# Patient Record
Sex: Female | Born: 1945 | Race: White | Hispanic: No | Marital: Married | Smoking: Former smoker
Health system: Southern US, Community
[De-identification: ages and names within clinical notes are randomized; demographics above are authoritative.]

## PROBLEM LIST (undated history)

## (undated) DIAGNOSIS — K219 Gastro-esophageal reflux disease without esophagitis: Secondary | ICD-10-CM

## (undated) DIAGNOSIS — H6990 Unspecified Eustachian tube disorder, unspecified ear: Secondary | ICD-10-CM

## (undated) DIAGNOSIS — IMO0002 Reserved for concepts with insufficient information to code with codable children: Secondary | ICD-10-CM

## (undated) DIAGNOSIS — T7840XA Allergy, unspecified, initial encounter: Secondary | ICD-10-CM

## (undated) DIAGNOSIS — J310 Chronic rhinitis: Secondary | ICD-10-CM

## (undated) DIAGNOSIS — E785 Hyperlipidemia, unspecified: Secondary | ICD-10-CM

## (undated) DIAGNOSIS — N390 Urinary tract infection, site not specified: Secondary | ICD-10-CM

## (undated) DIAGNOSIS — M8580 Other specified disorders of bone density and structure, unspecified site: Secondary | ICD-10-CM

## (undated) DIAGNOSIS — C50919 Malignant neoplasm of unspecified site of unspecified female breast: Secondary | ICD-10-CM

## (undated) DIAGNOSIS — M1990 Unspecified osteoarthritis, unspecified site: Secondary | ICD-10-CM

## (undated) DIAGNOSIS — H6980 Other specified disorders of Eustachian tube, unspecified ear: Secondary | ICD-10-CM

## (undated) DIAGNOSIS — H269 Unspecified cataract: Secondary | ICD-10-CM

## (undated) HISTORY — DX: Allergy, unspecified, initial encounter: T78.40XA

## (undated) HISTORY — DX: Other specified disorders of bone density and structure, unspecified site: M85.80

## (undated) HISTORY — DX: Unspecified eustachian tube disorder, unspecified ear: H69.90

## (undated) HISTORY — DX: Malignant neoplasm of unspecified site of unspecified female breast: C50.919

## (undated) HISTORY — DX: Urinary tract infection, site not specified: N39.0

## (undated) HISTORY — DX: Gastro-esophageal reflux disease without esophagitis: K21.9

## (undated) HISTORY — DX: Reserved for concepts with insufficient information to code with codable children: IMO0002

## (undated) HISTORY — DX: Unspecified cataract: H26.9

## (undated) HISTORY — DX: Chronic rhinitis: J31.0

## (undated) HISTORY — DX: Other specified disorders of Eustachian tube, unspecified ear: H69.80

## (undated) HISTORY — DX: Unspecified osteoarthritis, unspecified site: M19.90

## (undated) HISTORY — PX: CATARACT EXTRACTION: SUR2

## (undated) HISTORY — PX: COLONOSCOPY: SHX174

## (undated) HISTORY — DX: Hyperlipidemia, unspecified: E78.5

---

## 1991-08-30 DIAGNOSIS — C50919 Malignant neoplasm of unspecified site of unspecified female breast: Secondary | ICD-10-CM

## 1991-08-30 HISTORY — PX: MASTECTOMY: SHX3

## 1991-08-30 HISTORY — DX: Malignant neoplasm of unspecified site of unspecified female breast: C50.919

## 1997-12-23 ENCOUNTER — Other Ambulatory Visit: Admission: RE | Admit: 1997-12-23 | Discharge: 1997-12-23 | Payer: Self-pay | Admitting: Gynecology

## 1999-01-05 ENCOUNTER — Ambulatory Visit (HOSPITAL_COMMUNITY): Admission: RE | Admit: 1999-01-05 | Discharge: 1999-01-05 | Payer: Self-pay | Admitting: Gastroenterology

## 1999-11-22 ENCOUNTER — Encounter: Payer: Self-pay | Admitting: Oncology

## 1999-11-22 ENCOUNTER — Encounter: Admission: RE | Admit: 1999-11-22 | Discharge: 1999-11-22 | Payer: Self-pay | Admitting: Oncology

## 1999-12-02 ENCOUNTER — Other Ambulatory Visit: Admission: RE | Admit: 1999-12-02 | Discharge: 1999-12-02 | Payer: Self-pay | Admitting: Gynecology

## 2000-12-05 ENCOUNTER — Other Ambulatory Visit: Admission: RE | Admit: 2000-12-05 | Discharge: 2000-12-05 | Payer: Self-pay | Admitting: Gynecology

## 2001-06-27 ENCOUNTER — Encounter: Payer: Self-pay | Admitting: Family Medicine

## 2001-06-27 ENCOUNTER — Encounter: Admission: RE | Admit: 2001-06-27 | Discharge: 2001-06-27 | Payer: Self-pay | Admitting: Family Medicine

## 2001-07-10 ENCOUNTER — Encounter: Payer: Self-pay | Admitting: Family Medicine

## 2001-07-10 ENCOUNTER — Encounter: Admission: RE | Admit: 2001-07-10 | Discharge: 2001-07-10 | Payer: Self-pay | Admitting: Family Medicine

## 2001-11-13 ENCOUNTER — Encounter: Payer: Self-pay | Admitting: Oncology

## 2001-11-13 ENCOUNTER — Ambulatory Visit (HOSPITAL_COMMUNITY): Admission: RE | Admit: 2001-11-13 | Discharge: 2001-11-13 | Payer: Self-pay | Admitting: Oncology

## 2002-05-21 ENCOUNTER — Other Ambulatory Visit: Admission: RE | Admit: 2002-05-21 | Discharge: 2002-05-21 | Payer: Self-pay | Admitting: Obstetrics and Gynecology

## 2002-11-12 ENCOUNTER — Ambulatory Visit (HOSPITAL_COMMUNITY): Admission: RE | Admit: 2002-11-12 | Discharge: 2002-11-12 | Payer: Self-pay | Admitting: Oncology

## 2002-11-12 ENCOUNTER — Encounter: Payer: Self-pay | Admitting: Oncology

## 2003-07-15 ENCOUNTER — Encounter: Admission: RE | Admit: 2003-07-15 | Discharge: 2003-07-15 | Payer: Self-pay | Admitting: Family Medicine

## 2003-08-11 ENCOUNTER — Other Ambulatory Visit: Admission: RE | Admit: 2003-08-11 | Discharge: 2003-08-11 | Payer: Self-pay | Admitting: Obstetrics and Gynecology

## 2003-08-12 ENCOUNTER — Encounter: Admission: RE | Admit: 2003-08-12 | Discharge: 2003-08-12 | Payer: Self-pay | Admitting: Obstetrics and Gynecology

## 2003-10-25 ENCOUNTER — Emergency Department (HOSPITAL_COMMUNITY): Admission: EM | Admit: 2003-10-25 | Discharge: 2003-10-25 | Payer: Self-pay | Admitting: Emergency Medicine

## 2004-08-27 ENCOUNTER — Encounter: Admission: RE | Admit: 2004-08-27 | Discharge: 2004-08-27 | Payer: Self-pay | Admitting: Family Medicine

## 2004-10-15 ENCOUNTER — Other Ambulatory Visit: Admission: RE | Admit: 2004-10-15 | Discharge: 2004-10-15 | Payer: Self-pay | Admitting: Obstetrics and Gynecology

## 2004-12-14 ENCOUNTER — Ambulatory Visit: Payer: Self-pay | Admitting: Oncology

## 2004-12-14 ENCOUNTER — Encounter: Admission: RE | Admit: 2004-12-14 | Discharge: 2004-12-14 | Payer: Self-pay | Admitting: Oncology

## 2005-12-12 ENCOUNTER — Ambulatory Visit: Payer: Self-pay | Admitting: Oncology

## 2005-12-13 LAB — COMPREHENSIVE METABOLIC PANEL
ALT: 13 U/L (ref 0–40)
AST: 19 U/L (ref 0–37)
Albumin: 4.5 g/dL (ref 3.5–5.2)
Alkaline Phosphatase: 68 U/L (ref 39–117)
Calcium: 9.4 mg/dL (ref 8.4–10.5)
Chloride: 102 mEq/L (ref 96–112)
Potassium: 4 mEq/L (ref 3.5–5.3)
Sodium: 140 mEq/L (ref 135–145)
Total Protein: 6.5 g/dL (ref 6.0–8.3)

## 2005-12-13 LAB — CBC WITH DIFFERENTIAL/PLATELET
Basophils Absolute: 0 10*3/uL (ref 0.0–0.1)
EOS%: 1.7 % (ref 0.0–7.0)
Eosinophils Absolute: 0.1 10*3/uL (ref 0.0–0.5)
HGB: 14 g/dL (ref 11.6–15.9)
MCH: 30.7 pg (ref 26.0–34.0)
MCV: 89.7 fL (ref 81.0–101.0)
MONO%: 6.4 % (ref 0.0–13.0)
NEUT#: 2.9 10*3/uL (ref 1.5–6.5)
RBC: 4.57 10*6/uL (ref 3.70–5.32)
RDW: 12.2 % (ref 11.3–14.5)
lymph#: 1.9 10*3/uL (ref 0.9–3.3)

## 2006-03-08 ENCOUNTER — Ambulatory Visit: Payer: Self-pay | Admitting: Oncology

## 2006-09-14 ENCOUNTER — Encounter: Admission: RE | Admit: 2006-09-14 | Discharge: 2006-09-14 | Payer: Self-pay | Admitting: Family Medicine

## 2006-10-05 ENCOUNTER — Encounter: Admission: RE | Admit: 2006-10-05 | Discharge: 2006-10-05 | Payer: Self-pay | Admitting: Family Medicine

## 2006-12-05 ENCOUNTER — Ambulatory Visit: Payer: Self-pay | Admitting: Oncology

## 2006-12-08 LAB — COMPREHENSIVE METABOLIC PANEL
AST: 22 U/L (ref 0–37)
Alkaline Phosphatase: 82 U/L (ref 39–117)
Glucose, Bld: 95 mg/dL (ref 70–99)
Sodium: 139 mEq/L (ref 135–145)
Total Bilirubin: 0.4 mg/dL (ref 0.3–1.2)
Total Protein: 6.3 g/dL (ref 6.0–8.3)

## 2006-12-08 LAB — CBC WITH DIFFERENTIAL/PLATELET
BASO%: 0.6 % (ref 0.0–2.0)
EOS%: 1.8 % (ref 0.0–7.0)
Eosinophils Absolute: 0.1 10*3/uL (ref 0.0–0.5)
LYMPH%: 48.9 % — ABNORMAL HIGH (ref 14.0–48.0)
MCH: 31.5 pg (ref 26.0–34.0)
MCHC: 35.5 g/dL (ref 32.0–36.0)
MCV: 88.6 fL (ref 81.0–101.0)
MONO%: 8.4 % (ref 0.0–13.0)
Platelets: 234 10*3/uL (ref 145–400)
RBC: 4.36 10*6/uL (ref 3.70–5.32)
RDW: 12.1 % (ref 11.3–14.5)

## 2006-12-08 LAB — LACTATE DEHYDROGENASE: LDH: 167 U/L (ref 94–250)

## 2007-12-12 ENCOUNTER — Ambulatory Visit: Payer: Self-pay | Admitting: Oncology

## 2007-12-17 LAB — CBC WITH DIFFERENTIAL/PLATELET
BASO%: 1.8 % (ref 0.0–2.0)
Eosinophils Absolute: 0.1 10*3/uL (ref 0.0–0.5)
LYMPH%: 43.6 % (ref 14.0–48.0)
MCHC: 35.5 g/dL (ref 32.0–36.0)
MCV: 88.7 fL (ref 81.0–101.0)
MONO#: 0.4 10*3/uL (ref 0.1–0.9)
MONO%: 8.1 % (ref 0.0–13.0)
NEUT#: 2.1 10*3/uL (ref 1.5–6.5)
Platelets: 236 10*3/uL (ref 145–400)
RBC: 4.59 10*6/uL (ref 3.70–5.32)
RDW: 12.2 % (ref 11.3–14.5)
WBC: 4.6 10*3/uL (ref 3.9–10.0)

## 2007-12-18 LAB — COMPREHENSIVE METABOLIC PANEL
ALT: 17 U/L (ref 0–35)
Albumin: 4.3 g/dL (ref 3.5–5.2)
Alkaline Phosphatase: 78 U/L (ref 39–117)
Glucose, Bld: 89 mg/dL (ref 70–99)
Potassium: 4.3 mEq/L (ref 3.5–5.3)
Sodium: 139 mEq/L (ref 135–145)
Total Bilirubin: 0.5 mg/dL (ref 0.3–1.2)
Total Protein: 6.5 g/dL (ref 6.0–8.3)

## 2007-12-18 LAB — LACTATE DEHYDROGENASE: LDH: 165 U/L (ref 94–250)

## 2008-08-29 HISTORY — PX: OTHER SURGICAL HISTORY: SHX169

## 2008-12-12 ENCOUNTER — Ambulatory Visit: Payer: Self-pay | Admitting: Oncology

## 2008-12-16 LAB — CBC WITH DIFFERENTIAL/PLATELET
Basophils Absolute: 0 10*3/uL (ref 0.0–0.1)
Eosinophils Absolute: 0.1 10*3/uL (ref 0.0–0.5)
HGB: 14.3 g/dL (ref 11.6–15.9)
MCV: 90.9 fL (ref 79.5–101.0)
MONO#: 0.3 10*3/uL (ref 0.1–0.9)
NEUT#: 2.7 10*3/uL (ref 1.5–6.5)
RBC: 4.56 10*6/uL (ref 3.70–5.45)
RDW: 12.3 % (ref 11.2–14.5)
WBC: 5.1 10*3/uL (ref 3.9–10.3)
lymph#: 2 10*3/uL (ref 0.9–3.3)

## 2008-12-16 LAB — COMPREHENSIVE METABOLIC PANEL
Albumin: 4.5 g/dL (ref 3.5–5.2)
BUN: 14 mg/dL (ref 6–23)
Calcium: 9.3 mg/dL (ref 8.4–10.5)
Chloride: 104 mEq/L (ref 96–112)
Glucose, Bld: 84 mg/dL (ref 70–99)
Potassium: 4.1 mEq/L (ref 3.5–5.3)
Sodium: 142 mEq/L (ref 135–145)
Total Protein: 6.4 g/dL (ref 6.0–8.3)

## 2009-04-27 ENCOUNTER — Ambulatory Visit (HOSPITAL_COMMUNITY): Admission: RE | Admit: 2009-04-27 | Discharge: 2009-04-27 | Payer: Self-pay | Admitting: Obstetrics and Gynecology

## 2009-04-27 ENCOUNTER — Encounter (INDEPENDENT_AMBULATORY_CARE_PROVIDER_SITE_OTHER): Payer: Self-pay | Admitting: Obstetrics and Gynecology

## 2009-09-16 ENCOUNTER — Encounter: Admission: RE | Admit: 2009-09-16 | Discharge: 2009-09-16 | Payer: Self-pay | Admitting: Family Medicine

## 2009-09-16 LAB — HM DEXA SCAN

## 2009-12-10 ENCOUNTER — Ambulatory Visit: Payer: Self-pay | Admitting: Oncology

## 2009-12-14 LAB — CBC WITH DIFFERENTIAL/PLATELET
BASO%: 0.3 % (ref 0.0–2.0)
Basophils Absolute: 0 10*3/uL (ref 0.0–0.1)
EOS%: 1 % (ref 0.0–7.0)
Eosinophils Absolute: 0.1 10*3/uL (ref 0.0–0.5)
HCT: 41.8 % (ref 34.8–46.6)
HGB: 14.6 g/dL (ref 11.6–15.9)
LYMPH%: 30.3 % (ref 14.0–49.7)
MCH: 32.1 pg (ref 25.1–34.0)
MCHC: 35 g/dL (ref 31.5–36.0)
MCV: 91.7 fL (ref 79.5–101.0)
MONO#: 0.4 10*3/uL (ref 0.1–0.9)
MONO%: 6.5 % (ref 0.0–14.0)
NEUT#: 3.4 10*3/uL (ref 1.5–6.5)
NEUT%: 61.9 % (ref 38.4–76.8)
Platelets: 237 10*3/uL (ref 145–400)
RBC: 4.55 10*6/uL (ref 3.70–5.45)
RDW: 12 % (ref 11.2–14.5)
WBC: 5.4 10*3/uL (ref 3.9–10.3)
lymph#: 1.6 10*3/uL (ref 0.9–3.3)

## 2009-12-14 LAB — COMPREHENSIVE METABOLIC PANEL
ALT: 19 U/L (ref 0–35)
AST: 26 U/L (ref 0–37)
Albumin: 4.7 g/dL (ref 3.5–5.2)
Alkaline Phosphatase: 81 U/L (ref 39–117)
BUN: 15 mg/dL (ref 6–23)
CO2: 28 mEq/L (ref 19–32)
Calcium: 9.7 mg/dL (ref 8.4–10.5)
Chloride: 101 mEq/L (ref 96–112)
Creatinine, Ser: 0.8 mg/dL (ref 0.40–1.20)
Glucose, Bld: 93 mg/dL (ref 70–99)
Potassium: 3.9 mEq/L (ref 3.5–5.3)
Sodium: 140 mEq/L (ref 135–145)
Total Bilirubin: 0.5 mg/dL (ref 0.3–1.2)
Total Protein: 6.7 g/dL (ref 6.0–8.3)

## 2009-12-14 LAB — LACTATE DEHYDROGENASE: LDH: 190 U/L (ref 94–250)

## 2010-11-08 ENCOUNTER — Encounter (INDEPENDENT_AMBULATORY_CARE_PROVIDER_SITE_OTHER): Payer: Federal, State, Local not specified - PPO | Admitting: Internal Medicine

## 2010-11-08 DIAGNOSIS — E785 Hyperlipidemia, unspecified: Secondary | ICD-10-CM

## 2010-11-08 DIAGNOSIS — M899 Disorder of bone, unspecified: Secondary | ICD-10-CM

## 2010-11-08 DIAGNOSIS — M949 Disorder of cartilage, unspecified: Secondary | ICD-10-CM

## 2010-11-08 DIAGNOSIS — Z859 Personal history of malignant neoplasm, unspecified: Secondary | ICD-10-CM

## 2010-12-04 LAB — URINALYSIS, ROUTINE W REFLEX MICROSCOPIC
Nitrite: NEGATIVE
Protein, ur: NEGATIVE mg/dL
Specific Gravity, Urine: 1.01 (ref 1.005–1.030)
Urobilinogen, UA: 0.2 mg/dL (ref 0.0–1.0)

## 2010-12-04 LAB — CBC
Hemoglobin: 14.5 g/dL (ref 12.0–15.0)
MCHC: 34.3 g/dL (ref 30.0–36.0)
Platelets: 215 10*3/uL (ref 150–400)
RDW: 12.2 % (ref 11.5–15.5)

## 2010-12-13 ENCOUNTER — Other Ambulatory Visit: Payer: Self-pay | Admitting: Oncology

## 2010-12-13 ENCOUNTER — Encounter (HOSPITAL_BASED_OUTPATIENT_CLINIC_OR_DEPARTMENT_OTHER): Payer: Federal, State, Local not specified - PPO | Admitting: Oncology

## 2010-12-13 DIAGNOSIS — Z853 Personal history of malignant neoplasm of breast: Secondary | ICD-10-CM

## 2010-12-13 DIAGNOSIS — C50919 Malignant neoplasm of unspecified site of unspecified female breast: Secondary | ICD-10-CM

## 2010-12-13 LAB — COMPREHENSIVE METABOLIC PANEL
ALT: 20 U/L (ref 0–35)
Albumin: 4.7 g/dL (ref 3.5–5.2)
CO2: 27 mEq/L (ref 19–32)
Potassium: 4.1 mEq/L (ref 3.5–5.3)
Sodium: 142 mEq/L (ref 135–145)
Total Bilirubin: 0.5 mg/dL (ref 0.3–1.2)
Total Protein: 6.5 g/dL (ref 6.0–8.3)

## 2010-12-13 LAB — LACTATE DEHYDROGENASE: LDH: 181 U/L (ref 94–250)

## 2010-12-13 LAB — CBC WITH DIFFERENTIAL/PLATELET
BASO%: 0.4 % (ref 0.0–2.0)
Eosinophils Absolute: 0.1 10*3/uL (ref 0.0–0.5)
LYMPH%: 36.7 % (ref 14.0–49.7)
MCHC: 35 g/dL (ref 31.5–36.0)
MONO#: 0.3 10*3/uL (ref 0.1–0.9)
NEUT#: 2.4 10*3/uL (ref 1.5–6.5)
Platelets: 190 10*3/uL (ref 145–400)
RBC: 4.68 10*6/uL (ref 3.70–5.45)
RDW: 12.2 % (ref 11.2–14.5)
WBC: 4.5 10*3/uL (ref 3.9–10.3)
lymph#: 1.7 10*3/uL (ref 0.9–3.3)

## 2011-01-11 NOTE — Op Note (Signed)
[name], [name]                 ACCOUNT NO.:  [id number]   MEDICAL RECORD NO.:  [id number]          PATIENT TYPE:  AMB   LOCATION:  SDC                           FACILITY:  [facility]   PHYSICIAN:  Randye Lobo, M.D.   DATE OF BIRTH:  29-Sep-1945   DATE OF PROCEDURE:  04/27/2009  DATE OF DISCHARGE:                               OPERATIVE REPORT   PREOPERATIVE DIAGNOSES:  1. Postmenopausal bleeding.  2. Endometrial polyps.  3. History of breast cancer.   POSTOPERATIVE DIAGNOSES:  1. Postmenopausal bleeding.  2. Endometrial polyp.  3. History of breast cancer.   PROCEDURE:  Hysteroscopic polypectomy, dilation and curettage.   SURGEON:  Randye Lobo, MD   ANESTHESIA:  General endotracheal.   IV FLUIDS:  1000 mL Ringer lactate.   ESTIMATED BLOOD LOSS:  Minimal.   URINE OUTPUT:  75 mL by I and O catheterization prior to procedure.   COMPLICATIONS:  None.   INDICATIONS FOR PROCEDURE:  The patient is a 65 year old, para 1,  Caucasian female, status post bilateral mastectomies for left breast  cancer, status post tamoxifen of 5 years' duration, who presented with  an episode of postmenopausal bleeding.  The patient did have a long  history of Estring vaginal use with the knowledge and consent of her  oncologist.  The patient did have a pelvic ultrasound documenting an  endometrial stripe of 5.28 mm.  An endometrial biopsy documented a  benign endometrial polyp and proliferative endometrium.  The patient has  since stopped with her Estring use, and a recommendation was made to  proceed with a hysteroscopic polypectomy with dilation and curettage  after risks, benefits, and alternatives were reviewed.   FINDINGS:  Hysteroscopy demonstrated a 1-cm right lateral polyp of the  endometrium in the fundal region.  The endometrium was otherwise  atrophic.  The regions of tubal ostia were visualized and were  unremarkable.  There was no evidence of any fibroids appreciated.  The  endocervical canal was unremarkable as well.   SPECIMENS:  The endometrial polyp was sent to pathology separately from  the endometrial curettings.   PROCEDURE:  The patient was reidentified in the preoperative hold area.  She did receive Ancef 1 g IV for antibiotic prophylaxis.  The patient  did receive TED hose for DVT prophylaxis.   In the operating room, general endotracheal anesthesia was induced, and  the patient was placed in the dorsal lithotomy position.  The lower  abdomen, vagina, and perineum were sterilely prepped and draped.  The  patient was catheterized of urine.  An exam under anesthesia was  performed.   A speculum was placed inside the vagina, and the single-tooth tenaculum  placed on the anterior cervical lip.  The uterus was sounded to 7 cm.  The cervix was then dilated to a #21 Pratt dilator, and the diagnostic  scope was introduced under the continuous infusion of glycine solution.  The findings are as noted above.  An attempt was made to remove the  polyp with a polyp forceps through the hysteroscope, however, this was  not possible.  The cervix was further dilated to a #27 Pratt dilator, and the operative  hysteroscope was placed into the endometrial cavity under the infusion  of glycine.  Cautery was used to excise the polyp which was sent to  pathology.   The operating hysteroscope was removed, and the four quadrants of the  endometrium were gently curetted with a serrated curette.  The specimen  was sent to pathology separately from the polyp.   The single-tooth tenaculum was removed from the anterior cervical lip.  Hemostasis was good at this time.  The speculum was removed.   This concluded the patient's procedure.  There were no complications.  All needle, instrument, and sponge counts were correct.   The final glycine deficit was 50 mL.  The patient was escorted to the  recovery room in stable and awake condition.      Randye Lobo,  M.D.  Electronically Signed     BES/MEDQ  D:  04/27/2009  T:  04/28/2009  Job:  [id number]

## 2011-06-22 ENCOUNTER — Encounter: Payer: Self-pay | Admitting: Internal Medicine

## 2011-06-23 ENCOUNTER — Other Ambulatory Visit: Payer: Self-pay | Admitting: Internal Medicine

## 2011-06-23 ENCOUNTER — Ambulatory Visit (INDEPENDENT_AMBULATORY_CARE_PROVIDER_SITE_OTHER): Payer: Federal, State, Local not specified - PPO | Admitting: Internal Medicine

## 2011-06-23 ENCOUNTER — Encounter: Payer: Self-pay | Admitting: Internal Medicine

## 2011-06-23 ENCOUNTER — Ambulatory Visit
Admission: RE | Admit: 2011-06-23 | Discharge: 2011-06-23 | Disposition: A | Discharge status: Home / Self Care | Payer: Federal, State, Local not specified - PPO | Source: Ambulatory Visit | Attending: Internal Medicine | Admitting: Internal Medicine

## 2011-06-23 DIAGNOSIS — F411 Generalized anxiety disorder: Secondary | ICD-10-CM

## 2011-06-23 DIAGNOSIS — J310 Chronic rhinitis: Secondary | ICD-10-CM

## 2011-06-23 DIAGNOSIS — M8580 Other specified disorders of bone density and structure, unspecified site: Secondary | ICD-10-CM

## 2011-06-23 DIAGNOSIS — R071 Chest pain on breathing: Secondary | ICD-10-CM

## 2011-06-23 DIAGNOSIS — R0789 Other chest pain: Secondary | ICD-10-CM

## 2011-06-23 DIAGNOSIS — R52 Pain, unspecified: Secondary | ICD-10-CM

## 2011-06-23 DIAGNOSIS — M899 Disorder of bone, unspecified: Secondary | ICD-10-CM

## 2011-06-23 DIAGNOSIS — E785 Hyperlipidemia, unspecified: Secondary | ICD-10-CM

## 2011-06-23 DIAGNOSIS — H6980 Other specified disorders of Eustachian tube, unspecified ear: Secondary | ICD-10-CM

## 2011-06-23 DIAGNOSIS — F419 Anxiety disorder, unspecified: Secondary | ICD-10-CM

## 2011-06-23 DIAGNOSIS — N390 Urinary tract infection, site not specified: Secondary | ICD-10-CM

## 2011-06-23 DIAGNOSIS — Z853 Personal history of malignant neoplasm of breast: Secondary | ICD-10-CM

## 2011-06-23 DIAGNOSIS — H6990 Unspecified Eustachian tube disorder, unspecified ear: Secondary | ICD-10-CM

## 2011-06-24 ENCOUNTER — Ambulatory Visit: Payer: Federal, State, Local not specified - PPO | Admitting: Internal Medicine

## 2011-06-24 ENCOUNTER — Telehealth: Payer: Self-pay | Admitting: Internal Medicine

## 2011-06-25 DIAGNOSIS — M8580 Other specified disorders of bone density and structure, unspecified site: Secondary | ICD-10-CM | POA: Insufficient documentation

## 2011-06-25 DIAGNOSIS — E785 Hyperlipidemia, unspecified: Secondary | ICD-10-CM | POA: Insufficient documentation

## 2011-06-25 DIAGNOSIS — F419 Anxiety disorder, unspecified: Secondary | ICD-10-CM | POA: Insufficient documentation

## 2011-06-25 DIAGNOSIS — J310 Chronic rhinitis: Secondary | ICD-10-CM | POA: Insufficient documentation

## 2011-06-25 DIAGNOSIS — Z853 Personal history of malignant neoplasm of breast: Secondary | ICD-10-CM | POA: Insufficient documentation

## 2011-06-25 DIAGNOSIS — N390 Urinary tract infection, site not specified: Secondary | ICD-10-CM | POA: Insufficient documentation

## 2011-06-25 DIAGNOSIS — H6980 Other specified disorders of Eustachian tube, unspecified ear: Secondary | ICD-10-CM | POA: Insufficient documentation

## 2011-06-25 NOTE — Patient Instructions (Signed)
May apply ice or heat to left chest wall. Have prescribed ibuprofen 800 mg to take 3 times daily with food for 10 days. Call if not better in 72 hours or sooner if worse. Chest x-ray has been ordered. We will call you with result of chest x-ray.

## 2011-06-25 NOTE — Progress Notes (Signed)
  Subjective:    Patient ID: [name], female    DOB: 11-26-1945, 65 y.o.   MRN: [id number]  HPI 65 year old white female with history of bilateral mastectomies 1993 by Dr. Jamey Ripa. Also had chemotherapy. History of nonallergic rhinitis, hyperlipidemia, osteopenia, degenerative joint disease. Uses estrogen replacement in the form of a strain. History of recurrent urinary tract infections. History of anxiety.  Occasional wine consumption, nonsmoker. Married, one daughter.  Family history father living age 63 good for his age. Mother died at age 27 as a result of the stroke. No siblings.  Colonoscopy 2005 by Dr. Kinnie Scales.  Tetanus immunization 2007. Gets annual influenza immunization. Had Pneumovax 2006.  Presented here for the first time March 2012. Formerly seen by Dr. Smith Mince and Dr. Raquel James.  Recently his been having some discomfort in her chest. Says she doesn't think it's cardiac in nature but sensation has been uncomfortable particularly at night trying to sleep. Seems worse with taking a deep breath. Some burning sensation as well. Doesn't recall any heavy lifting or heavy exercise. No nausea vomiting or diaphoresis. No radiation of pain into neck and down left arm.    Review of Systems     Objective:   Physical Exam pulse oximetry on room air is normal. Neck is supple without thyromegaly. No JVD or carotid bruits. Chest is clear to auscultation no rales or friction rub appreciated. Cardiac exam regular rate and rhythm normal S1 and S2. She has palpable chest wall tenderness in left parasternal area and also in additional areas  mastectomy site left chest.        Assessment & Plan:  Impression: Chest wall pain  Plan: Ibuprofen 800 mg by mouth 3 times a day with food for 10 days. May apply ice or heat to the left chest wall. Chest x-ray will be ordered. Patient reassured. Call if not better in 72 hours or sooner if worse. Time spent evaluating patient 35 minutes

## 2011-07-01 NOTE — Telephone Encounter (Signed)
Dr. Baxley aware 

## 2011-11-23 ENCOUNTER — Other Ambulatory Visit: Payer: Self-pay | Admitting: Internal Medicine

## 2011-11-28 ENCOUNTER — Telehealth: Payer: Self-pay | Admitting: Oncology

## 2011-11-28 NOTE — Telephone Encounter (Signed)
lmonvm for pt re appts for 4/17 and 4/23. Schedule mailed.

## 2011-12-14 ENCOUNTER — Other Ambulatory Visit: Payer: Federal, State, Local not specified - PPO | Admitting: Lab

## 2011-12-20 ENCOUNTER — Ambulatory Visit: Payer: Federal, State, Local not specified - PPO | Admitting: Oncology

## 2012-01-05 ENCOUNTER — Other Ambulatory Visit: Payer: Federal, State, Local not specified - PPO | Admitting: Internal Medicine

## 2012-01-05 DIAGNOSIS — Z0000 Encounter for general adult medical examination without abnormal findings: Secondary | ICD-10-CM

## 2012-01-05 DIAGNOSIS — C50919 Malignant neoplasm of unspecified site of unspecified female breast: Secondary | ICD-10-CM

## 2012-01-05 LAB — COMPREHENSIVE METABOLIC PANEL
ALT: 17 U/L (ref 0–35)
CO2: 28 mEq/L (ref 19–32)
Calcium: 9.7 mg/dL (ref 8.4–10.5)
Chloride: 101 mEq/L (ref 96–112)
Sodium: 137 mEq/L (ref 135–145)
Total Bilirubin: 0.6 mg/dL (ref 0.3–1.2)
Total Protein: 6.4 g/dL (ref 6.0–8.3)

## 2012-01-05 LAB — CBC WITH DIFFERENTIAL/PLATELET
Lymphocytes Relative: 33 % (ref 12–46)
Lymphs Abs: 1.8 10*3/uL (ref 0.7–4.0)
Neutrophils Relative %: 59 % (ref 43–77)
Platelets: 217 10*3/uL (ref 150–400)
RBC: 4.74 MIL/uL (ref 3.87–5.11)
WBC: 5.4 10*3/uL (ref 4.0–10.5)

## 2012-01-05 LAB — LIPID PANEL
Cholesterol: 168 mg/dL (ref 0–200)
VLDL: 17 mg/dL (ref 0–40)

## 2012-01-05 LAB — TSH: TSH: 3.202 u[IU]/mL (ref 0.350–4.500)

## 2012-01-06 ENCOUNTER — Encounter: Payer: Federal, State, Local not specified - PPO | Admitting: Internal Medicine

## 2012-01-06 ENCOUNTER — Ambulatory Visit (INDEPENDENT_AMBULATORY_CARE_PROVIDER_SITE_OTHER): Payer: Federal, State, Local not specified - PPO | Admitting: Internal Medicine

## 2012-01-06 ENCOUNTER — Encounter: Payer: Self-pay | Admitting: Internal Medicine

## 2012-01-06 VITALS — BP 120/76 | HR 80 | Temp 99.0°F | Ht 66.0 in | Wt 156.0 lb

## 2012-01-06 DIAGNOSIS — Z0000 Encounter for general adult medical examination without abnormal findings: Secondary | ICD-10-CM

## 2012-01-06 LAB — POCT URINALYSIS DIPSTICK
Bilirubin, UA: NEGATIVE
Leukocytes, UA: NEGATIVE
Nitrite, UA: NEGATIVE
Protein, UA: NEGATIVE
Urobilinogen, UA: NEGATIVE
pH, UA: 6

## 2012-01-06 LAB — VITAMIN D 25 HYDROXY (VIT D DEFICIENCY, FRACTURES): Vit D, 25-Hydroxy: 52 ng/mL (ref 30–89)

## 2012-02-19 ENCOUNTER — Other Ambulatory Visit: Payer: Self-pay | Admitting: Internal Medicine

## 2012-02-20 ENCOUNTER — Other Ambulatory Visit: Payer: Self-pay

## 2012-02-20 MED ORDER — NIACIN-LOVASTATIN ER 500-20 MG PO TB24: 1.0000 | ORAL_TABLET | Freq: Every day | ORAL | Status: DC

## 2012-02-28 ENCOUNTER — Encounter: Payer: Self-pay | Admitting: Internal Medicine

## 2012-02-28 NOTE — Progress Notes (Signed)
Subjective:    Patient ID: [name], female    DOB: [birth year], 66 y.o.   MRN: [id number]  HPI 66 year old white female with history of bilateral mastectomies in 1993 by Dr. Jamey Ripa. Also had chemotherapy for breast cancer. Present for the first time here March 2012. 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 Formerly seen by Dr. Raquel James. Had Pneumovax immunization 09/27/2004. Had tetanus immunization 09/27/2005. Had colonoscopy by Dr. Kinnie Scales August 2005. History of nonallergic rhinitis. History of eustachian tube dysfunction.  Had uterine polyp removed in 2010, fractured toe 2011, left frozen shoulder 1993 and right first and shoulder 2011. Patient is intolerant of Macrodantin causes time spent swelling.  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 Husband is an Warden/ranger judge for the [employer]. Patient does not smoke. Social alcohol consumption.  Family history: Mother died at age 22 of a stroke. No siblings. One daughter. Father with history of MI hypertension and stroke.    Review of Systems  Constitutional: Negative.   HENT: Negative.   Eyes: Negative.   Respiratory: Negative.   Cardiovascular: Negative.   Gastrointestinal: Negative.   Genitourinary: Negative.   Musculoskeletal: Negative.   Neurological: Negative.   Hematological: Negative.   Psychiatric/Behavioral: Negative.        Objective:   Physical Exam  Vitals reviewed. Constitutional: She is oriented to person, place, and time. She appears well-developed and well-nourished. No distress.  HENT:  Head: Normocephalic and atraumatic.  Right Ear: External ear normal.  Left Ear: External ear normal.  Mouth/Throat: Oropharynx is clear and moist. No oropharyngeal exudate.  Eyes: Conjunctivae and EOM are normal. Pupils are equal, round, and reactive to  light. Right eye exhibits no discharge. Left eye exhibits no discharge. No scleral icterus.  Neck: Neck supple. No JVD present. No thyromegaly present.  Cardiovascular: Normal rate, regular rhythm and normal heart sounds.   No murmur heard. Pulmonary/Chest: Effort normal. No respiratory distress. She has no wheezes. She has no rales. She exhibits no tenderness.       Bilateral mastectomies  Abdominal: Soft. Bowel sounds are normal. She exhibits no mass. There is no tenderness. There is no rebound and no guarding.  Genitourinary:       Deferred to Dr. Edward Jolly  Musculoskeletal: She exhibits no edema and no tenderness.  Lymphadenopathy:    She has no cervical adenopathy.  Neurological: She is alert and oriented to person, place, and time. She has normal reflexes. She displays normal reflexes. No cranial nerve deficit. Coordination normal.  Skin: Skin is warm and dry. No rash noted. She is not diaphoretic. No erythema. No pallor.  Psychiatric: She has a normal mood and affect. Her behavior is normal. Judgment and thought content normal.          Assessment & Plan:  History of breast cancer 1993 status post bilateral mastectomies and chemotherapy  History of nonallergic rhinitis  Hyperlipidemia  Osteopenia  History of eustachian tube dysfunction  History of recurrent urinary tract infections  Insomnia  Plan: Fasting labs reviewed including lipid panel which is entirely within normal limits. Vitamin D level normal. TSH is 3.202.  Plan: Prescription for Ativan 1 mg #60 one or 2 by mouth each bedtime. Formerly took Ambien and would like to try something else. She has been on niacin lovastatin daily for hyperlipidemia. Recommend change to Lipitor 10-20 mg daily. Return in 6 months for  office visit lipid panel liver functions TSH

## 2012-02-28 NOTE — Patient Instructions (Addendum)
Return in 6 months for fasting lipid panel liver functions TSH and office visit.

## 2012-05-29 ENCOUNTER — Encounter: Payer: Self-pay | Admitting: Family Medicine

## 2012-05-29 ENCOUNTER — Ambulatory Visit (INDEPENDENT_AMBULATORY_CARE_PROVIDER_SITE_OTHER): Payer: Federal, State, Local not specified - PPO | Admitting: Family Medicine

## 2012-05-29 VITALS — BP 118/76 | HR 67 | Temp 98.4°F | Resp 16 | Ht 67.0 in | Wt 161.2 lb

## 2012-05-29 DIAGNOSIS — S8010XA Contusion of unspecified lower leg, initial encounter: Secondary | ICD-10-CM

## 2012-05-29 DIAGNOSIS — S8011XA Contusion of right lower leg, initial encounter: Secondary | ICD-10-CM

## 2012-05-29 NOTE — Progress Notes (Signed)
  Subjective:    Patient ID: [name], female    DOB: 06/19/1946, 66 y.o.   MRN: [id number]  HPI  Sun night she hit her Rt shin on the bedpost. It hurt quite a lot at the time but the yesterday it felt fine.  She played tennis, went on a long walk, on feet all day and didn't bother her but then last night she woke from sleep w/ severe shin pain about 3 am.  Couldn't go back to sleep due to severity of pain. Took 3 ibuprofen and ice w/o relief.  Now developing some subtle blue bruising over shin. Now Rt knee is starting to swell too    Review of Systems  Constitutional: Negative for fever, chills, diaphoresis, activity change and fatigue.  Respiratory: Negative for cough and shortness of breath.   Cardiovascular: Negative for chest pain and leg swelling.  Musculoskeletal: Positive for joint swelling. Negative for gait problem.  Skin: Positive for color change. Negative for rash and wound.  Neurological: Negative for weakness and numbness.  Hematological: Does not bruise/bleed easily.  Psychiatric/Behavioral: Positive for disturbed wake/sleep cycle.       Objective:   Physical Exam  Vitals reviewed. Constitutional: She is oriented to person, place, and time. She appears well-developed and well-nourished. No distress.  HENT:  Head: Normocephalic and atraumatic.  Right Ear: External ear normal.  Left Ear: External ear normal.  Eyes: Conjunctivae normal are normal. No scleral icterus.  Cardiovascular: Intact distal pulses.   Pulses:      Dorsalis pedis pulses are 2+ on the right side.       Posterior tibial pulses are 2+ on the right side.  Pulmonary/Chest: Effort normal.  Musculoskeletal: She exhibits tenderness. She exhibits no edema.       Right knee: She exhibits effusion. She exhibits normal range of motion, no ecchymosis, no erythema, normal alignment, no LCL laxity, normal patellar mobility, no bony tenderness, normal meniscus and no MCL laxity. no tenderness found.    Neurological: She is alert and oriented to person, place, and time. She exhibits normal muscle tone.  Skin: Skin is warm and dry. No rash noted. She is not diaphoretic. No erythema.          Developing light blue contusion over proximal right tibia approx 4in x 2in, sig tender to palpaiton, no bony abnormality palp. Calf circumference 5 in distal to tibial tuberosity is 13 in bilaterally  Psychiatric: She has a normal mood and affect. Her behavior is normal.          Assessment & Plan:  1. Contusion - I suspect this is just developing slowly, perhaps due to depth and increase circulation yest. Cont watchful waiting. RTC if worsens at all. Reviewed warning signs for dvt.

## 2012-08-02 ENCOUNTER — Telehealth: Payer: Self-pay | Admitting: Internal Medicine

## 2012-08-02 NOTE — Telephone Encounter (Signed)
Patient not seen over 6 months. Last visit May 2013. She called asking if medication can be changed. Apparently it has become expensive. She will need office visit to discuss.

## 2012-08-09 ENCOUNTER — Ambulatory Visit (INDEPENDENT_AMBULATORY_CARE_PROVIDER_SITE_OTHER): Payer: Federal, State, Local not specified - PPO | Admitting: Internal Medicine

## 2012-08-09 ENCOUNTER — Encounter: Payer: Self-pay | Admitting: Internal Medicine

## 2012-08-09 VITALS — BP 116/80 | HR 76 | Temp 98.6°F

## 2012-08-09 DIAGNOSIS — E785 Hyperlipidemia, unspecified: Secondary | ICD-10-CM

## 2012-08-09 NOTE — Progress Notes (Signed)
  Subjective:    Patient ID: [name], female    DOB: 1945-09-30, 66 y.o.   MRN: [id number]  HPI 66 year old white female with history of hyperlipidemia maintained on Advicor (lovastatin plus niacin )for a number of years. Now finding insurance will not cover that it at a  reasonable price. Asking what to do instead. History of mild hypertriglyceridemia and elevated LDL cholesterol dating back to 2006. She was under the care of Dr. Smith Mince at that time.    Review of Systems     Objective:   Physical Exam neck is supple without thyromegaly or adenopathy. Chest clear to auscultation. Cardiac exam regular rate and rhythm normal S1 and S2. Extremities without edema.        Assessment & Plan:  Hyperlipidemia-unable to continue with Advicor because of insurance coverage  Plan: Switch to Lipitor and reevaluate in 3-6 months

## 2012-10-28 ENCOUNTER — Encounter: Payer: Self-pay | Admitting: Internal Medicine

## 2012-10-28 NOTE — Patient Instructions (Addendum)
Switch to Lipitor and reevaluate in 3-6 months

## 2012-11-20 ENCOUNTER — Other Ambulatory Visit: Payer: Federal, State, Local not specified - PPO | Admitting: Internal Medicine

## 2012-11-22 ENCOUNTER — Ambulatory Visit: Payer: Federal, State, Local not specified - PPO | Admitting: Internal Medicine

## 2012-11-29 ENCOUNTER — Other Ambulatory Visit: Payer: Federal, State, Local not specified - PPO | Admitting: Internal Medicine

## 2012-11-29 DIAGNOSIS — Z79899 Other long term (current) drug therapy: Secondary | ICD-10-CM

## 2012-11-29 DIAGNOSIS — E785 Hyperlipidemia, unspecified: Secondary | ICD-10-CM

## 2012-11-29 LAB — HEPATIC FUNCTION PANEL
AST: 23 U/L (ref 0–37)
Alkaline Phosphatase: 94 U/L (ref 39–117)
Bilirubin, Direct: 0.1 mg/dL (ref 0.0–0.3)
Indirect Bilirubin: 0.4 mg/dL (ref 0.0–0.9)
Total Bilirubin: 0.5 mg/dL (ref 0.3–1.2)

## 2012-11-29 LAB — LIPID PANEL: HDL: 62 mg/dL (ref >39)

## 2012-11-30 ENCOUNTER — Encounter: Payer: Self-pay | Admitting: Internal Medicine

## 2012-11-30 ENCOUNTER — Ambulatory Visit (INDEPENDENT_AMBULATORY_CARE_PROVIDER_SITE_OTHER): Payer: Federal, State, Local not specified - PPO | Admitting: Internal Medicine

## 2012-11-30 VITALS — BP 114/76 | HR 76 | Wt 160.0 lb

## 2012-11-30 DIAGNOSIS — E785 Hyperlipidemia, unspecified: Secondary | ICD-10-CM

## 2012-11-30 DIAGNOSIS — R1031 Right lower quadrant pain: Secondary | ICD-10-CM

## 2012-11-30 MED ORDER — ATORVASTATIN CALCIUM 10 MG PO TABS: 10.0000 mg | ORAL_TABLET | Freq: Every day | ORAL | Status: DC

## 2012-11-30 NOTE — Progress Notes (Signed)
  Subjective:    Patient ID: [name], female    DOB: [birth year], 67 y.o.   MRN: [id number]  HPI  67 year old white female with history of hyperlipidemia previously took Advicor for hyperlipidemia but insurance will no longer pay for it. Subsequently in December 2013 we switched her to generic Lipitor which she has tolerated well. Lipid panel liver functions are entirely within normal limits. No complaints or problems with generic Lipitor.    Review of Systems     Objective:   Physical Exam  Spent 15 minutes speaking with patient about issues of hyperlipidemia. Asking if she should continue to take niacin. I see no advantage to that at the present time. Also she's been having some issues with right lower quadrant pain in her abdomen since January. Thinks it may be a pulled muscle. Is due for GYN check in the near future. Told her it would be a good idea the ovaries checked. Says she's due for repeat colonoscopy and that would also be a good idea this point in time. She agrees to have these 2 items taken care of for physical exam in Fall 2014       Assessment & Plan:  Hyperlipidemia-normal lipid panel liver functions on low-dose Lipitor generic  Plan: Return Fall 2014 for physical examination.  Order given for bone density study in the near future. She has had bilateral mastectomies and doesn't get mammograms.

## 2012-11-30 NOTE — Patient Instructions (Addendum)
Proceed with GYN exam and colonoscopy which is now due. Get bone density study. Return fall 2014 for physical exam. Continue generic Lipitor.

## 2012-12-12 ENCOUNTER — Ambulatory Visit (INDEPENDENT_AMBULATORY_CARE_PROVIDER_SITE_OTHER): Payer: Federal, State, Local not specified - PPO | Admitting: Obstetrics and Gynecology

## 2012-12-12 ENCOUNTER — Encounter: Payer: Self-pay | Admitting: Obstetrics and Gynecology

## 2012-12-12 VITALS — BP 100/60 | Ht 67.0 in | Wt 160.0 lb

## 2012-12-12 DIAGNOSIS — N72 Inflammatory disease of cervix uteri: Secondary | ICD-10-CM

## 2012-12-12 DIAGNOSIS — R1031 Right lower quadrant pain: Secondary | ICD-10-CM

## 2012-12-12 LAB — POCT URINALYSIS DIPSTICK
Bilirubin, UA: NEGATIVE
Glucose, UA: NEGATIVE
Ketones, UA: NEGATIVE
Leukocytes, UA: NEGATIVE
Nitrite, UA: NEGATIVE

## 2012-12-12 NOTE — Progress Notes (Signed)
Patient ID: [name], female   DOB: 10/06/1945, 67 y.o.   MRN: [id number]  SUBJECTIVE  Patient presents for evaluation of right lower quadrant pain of 3 months duration.  States pain began as twinges.  Feels that movement may bring on the discomfort.  No pain medication use.  Saw Dr. Lenord Fellers, here PCP who said to see gynecologist and then do colonoscopy.  No pelvic or abdominal ultrasounds done to date.  Using Estring for several years.  Has bleeding only when removes Estring.  Has chronic low back pain. Uses ibuprofen if has increased pain from tennis activity. Denies urinary problems.  When uses Estring no recurrent UTIs.  Last UTI one year ago.  Denies change in bowel habits.  No black tarry stools.  Has occasional rectal bleeding with straining.  History of rectal fissure.  No change in general health. Chart reviewed.  Normal pap in June 2013.  OBJECTIVE  Abdomen - Normal bowel sounds, soft, and nontender.  No hepatosplenomegaly or organomegaly. Pelvic - Normal external genitalia and urethra.  Cervix and vaginal cuff with erythema and minor amount of blood.  No lesions noted.  Cervix nontender.  Small uterus, nontender.  No adnexal masses or tenderness. Rectovaginal exam confirms the pelvic exam.  Urine dip - negative  ASSESSMENT  RLQ pain of uncertain etiology. No UTI. Estring user.  Has some cervicitis due to prolonged contact with Estring.  PLAN  Patient will keep Estring out for a few days due to cervicitis and to see if discomfort in RLQ resolves. Return for pelvic ultrasound.

## 2012-12-12 NOTE — Patient Instructions (Signed)
Abdominal Pain  Abdominal pain can be caused by many things. Your caregiver decides the seriousness of your pain by an examination and possibly blood tests and X-rays. Many cases can be observed and treated at home. Most abdominal pain is not caused by a disease and will probably improve without treatment. However, in many cases, more time must pass before a clear cause of the pain can be found. Before that point, it may not be known if you need more testing, or if hospitalization or surgery is needed.  HOME CARE INSTRUCTIONS   · Do not take laxatives unless directed by your caregiver.  · Take pain medicine only as directed by your caregiver.  · Only take over-the-counter or prescription medicines for pain, discomfort, or fever as directed by your caregiver.  · Try a clear liquid diet (broth, tea, or water) for as long as directed by your caregiver. Slowly move to a bland diet as tolerated.  SEEK IMMEDIATE MEDICAL CARE IF:   · The pain does not go away.  · You have a fever.  · You keep throwing up (vomiting).  · The pain is felt only in portions of the abdomen. Pain in the right side could possibly be appendicitis. In an adult, pain in the left lower portion of the abdomen could be colitis or diverticulitis.  · You pass bloody or black tarry stools.  MAKE SURE YOU:   · Understand these instructions.  · Will watch your condition.  · Will get help right away if you are not doing well or get worse.  Document Released: 05/25/2005 Document Revised: 11/07/2011 Document Reviewed: 04/02/2008  [employer]® Patient Information ©2013 [employer].

## 2012-12-18 ENCOUNTER — Telehealth: Payer: Self-pay | Admitting: *Deleted

## 2012-12-18 NOTE — Telephone Encounter (Signed)
PATIENT SCHEDULED FOR PELVIC ULTRASOUND 12-19-12.

## 2012-12-19 ENCOUNTER — Encounter: Payer: Self-pay | Admitting: Obstetrics and Gynecology

## 2012-12-19 ENCOUNTER — Ambulatory Visit (INDEPENDENT_AMBULATORY_CARE_PROVIDER_SITE_OTHER): Payer: Federal, State, Local not specified - PPO

## 2012-12-19 ENCOUNTER — Ambulatory Visit (INDEPENDENT_AMBULATORY_CARE_PROVIDER_SITE_OTHER): Payer: Federal, State, Local not specified - PPO | Admitting: Obstetrics and Gynecology

## 2012-12-19 VITALS — BP 104/66

## 2012-12-19 DIAGNOSIS — R1031 Right lower quadrant pain: Secondary | ICD-10-CM

## 2012-12-19 DIAGNOSIS — N952 Postmenopausal atrophic vaginitis: Secondary | ICD-10-CM

## 2012-12-19 MED ORDER — ESTRADIOL 2 MG VA RING: 2.0000 mg | VAGINAL_RING | VAGINAL | Status: DC

## 2012-12-19 NOTE — Patient Instructions (Addendum)
Please return for your annual exam in June 2014.

## 2012-12-19 NOTE — Progress Notes (Signed)
Subjective  Patient presents for pelvic ultrasound to evaluate RLQ pain.  Has been taking the Estring in and out a lot for recent pelvic exams and ultrasound, but has not left it out for any length of time. Needs a refill on Estring.  AEX due in June 2014.  Objective  See ultrasound below.      Assessment  RLQ pain. Normal pelvic anatomy. Using Estring for atrophic vaginitis and to reduce recurrent urinary tract infections  Plan  OK to have refill on Estring.  See EPIC orders.  When due to change out the Estring, wait 5 to 7 days before inserting new Estring to decrease cervical inflammation. Patient will proceed with a colonoscopy for further evaluation of pain. AEX in June 2014.

## 2012-12-24 ENCOUNTER — Other Ambulatory Visit: Payer: Self-pay | Admitting: Internal Medicine

## 2013-01-04 ENCOUNTER — Telehealth: Payer: Self-pay | Admitting: Genetic Counselor

## 2013-01-04 NOTE — Telephone Encounter (Signed)
S/W PT IN RE TO GENETIC APPT 06/23 @ 2 [name]. [employer]COM[employer] PACKET MAILED

## 2013-02-01 ENCOUNTER — Other Ambulatory Visit: Payer: Self-pay

## 2013-02-01 MED ORDER — ZOLPIDEM TARTRATE 10 MG PO TABS: 10.0000 mg | ORAL_TABLET | Freq: Every evening | ORAL | Status: DC | PRN

## 2013-02-18 ENCOUNTER — Ambulatory Visit (HOSPITAL_BASED_OUTPATIENT_CLINIC_OR_DEPARTMENT_OTHER): Payer: Federal, State, Local not specified - PPO | Admitting: Genetic Counselor

## 2013-02-18 ENCOUNTER — Other Ambulatory Visit: Payer: Federal, State, Local not specified - PPO | Admitting: Lab

## 2013-02-18 ENCOUNTER — Encounter: Payer: Self-pay | Admitting: Genetic Counselor

## 2013-02-18 DIAGNOSIS — IMO0002 Reserved for concepts with insufficient information to code with codable children: Secondary | ICD-10-CM

## 2013-02-18 DIAGNOSIS — Z853 Personal history of malignant neoplasm of breast: Secondary | ICD-10-CM

## 2013-02-18 NOTE — Progress Notes (Signed)
Dr.  Mervin Kung requested a consultation for genetic counseling and risk assessment for [name], a 67 y.o. female, for discussion of her personal history of breast cancer at age 23 and family history of breast and prostate cancer.  She presents to clinic today to discuss the possibility of a genetic predisposition to cancer, and to further clarify her risks, as well as her family members' risks for cancer.   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 This was treated with bilateral mastectomy and chemotherapy.  She was tested for mutations within the BRCA genes and was negative, in 2007.  However, she did not have del/dup testing. A colonoscopy was performed in 2005 which was negative.  Her daughter has requested further testing as she has started being followed for breast cancer in [place] in a high risk clinic.   Past Medical History  Diagnosis Date  . Cancer     breast  . Rhinitis, nonallergic   . Hyperlipidemia   . Osteopenia   . Chronic UTI   . Eustachian tube dysfunction   . Dyspareunia   . Breast cancer 1993    Past Surgical History  Procedure Laterality Date  . Mastectomy  1993    bilateral  . Endometrial polyp  2010    Dr. Edward Jolly    History   Social History  . Marital Status: Married    Spouse Name: N/A    Number of Children: 1  . Years of Education: N/A   Social History Main Topics  . Smoking status: Former Smoker -- 0.50 packs/day for 3.5 years    Types: Cigarettes    Quit date: 06/22/1964  . Smokeless tobacco: Never Used  . Alcohol Use: Yes     Comment: social  . Drug Use: No  . Sexually Active: Yes    Birth Control/ Protection: None   Other Topics Concern  . None   Social History Narrative  . None    REPRODUCTIVE HISTORY AND PERSONAL RISK ASSESSMENT FACTORS: Menarche was at age 82.   postmenopausal Uterus Intact: yes Ovaries Intact:  yes G1P1A0, first live birth at age 36  She has not previously undergone treatment for infertility.   Oral Contraceptive use: 8 years   She has not used HRT in the past.    FAMILY HISTORY:  We obtained a detailed, 4-generation family history.  Significant diagnoses are listed below: Family History  Problem Relation Age of Onset  . Stroke Mother   . Hypertension Mother   . Hypertension Father   . Prostate cancer Father 72  . Diabetes Maternal Grandfather   . Breast cancer Paternal Grandmother     dx in her 42s  The patient is an only child.  Her mother had three brothers who were not affected with cancer.  Her father had a sister who died in childhood.  He had prostate cancer at 21 and his mother had breast cancer in her 45s.  Patient's maternal ancestors are of [place] descent, and paternal ancestors are of [place] and [place] descent. There is no reported Ashkenazi Jewish ancestry. There is no  known consanguinity.  GENETIC COUNSELING ASSESSMENT: [name] is a 67 y.o. female with a personal history of personal history of lobular invasive cancer which somewhat suggestive of a hereditary breast cancer and predisposition to cancer. We, therefore, discussed and recommended the following at today's visit.  DISCUSSION: We reviewed the characteristics, features and inheritance patterns of hereditary cancer syndromes. We also discussed genetic testing, including the appropriate family members to test, the process of testing, insurance coverage and turn-around-time for results. The patient has been tested for BRCA mutations in 2007, but did not have the del/dup testing.  Additionally, BRCA is typically more frequently associated with ductal carcinoma.  However, sometimes lobular carcinoma can be seen in CDH1 mutations.  There is not a family history of gastric cancer, which is also seen with these mutations.  Based on her family history of cancer, and personal history of early onset  breast cancer, we recommended [name] pursue genetic testing for the breast and ovarian cancer panel at [facility].   PLAN: After considering the risks, benefits, and limitations, [name] provided informed consent to pursue genetic testing and the blood sample will be sent to [facility] for analysis of the Breast/ovarian cancer panel. We discussed the implications of a positive, negative and/ or variant of uncertain significance genetic test result. Results should be available within approximately 3-4 weeks' time, at which point they will be disclosed by telephone to [name], as will any additional her recommendations warranted by these results. [name] will receive a summary of her genetic counseling visit and a copy of her results once available. This information will also be available in Epic. We encouraged [name] to remain in contact with cancer genetics annually so that we can continuously update the family history and inform her of any changes in cancer genetics and testing that may be of benefit for her family. [name]'s questions were answered to her satisfaction today. Our contact information was provided should additional questions or concerns arise.    Per the patient's request, we will contact her by telephone to discuss these results. A follow up genetic counseling visit will be scheduled if indicated.  The patient was seen for a total of 60 minutes, greater than 50% of which was spent face-to-face counseling.  This plan is being carried out per Dr. Feliz Beam recommendations.  This note will also be sent to the referring provider via the electronic medical record. The patient will be supplied with a summary of this genetic counseling discussion as well as educational information on the discussed hereditary cancer syndromes following the conclusion of their visit.   Patient was discussed with Dr. Drue Second.   _______________________________________________________________________ For Office Staff:  Number of people involved in session: 1 Was an Intern/ student involved with case: no

## 2013-02-19 ENCOUNTER — Other Ambulatory Visit: Payer: Federal, State, Local not specified - PPO | Admitting: Lab

## 2013-02-21 ENCOUNTER — Other Ambulatory Visit: Payer: Self-pay | Admitting: Internal Medicine

## 2013-02-21 ENCOUNTER — Other Ambulatory Visit: Payer: Self-pay | Admitting: Obstetrics and Gynecology

## 2013-02-21 ENCOUNTER — Telehealth: Payer: Self-pay

## 2013-02-21 DIAGNOSIS — M8580 Other specified disorders of bone density and structure, unspecified site: Secondary | ICD-10-CM

## 2013-02-21 DIAGNOSIS — Z780 Asymptomatic menopausal state: Secondary | ICD-10-CM

## 2013-02-21 NOTE — Telephone Encounter (Signed)
Bone density ordered in Epic

## 2013-03-07 ENCOUNTER — Other Ambulatory Visit: Payer: Federal, State, Local not specified - PPO

## 2013-03-20 ENCOUNTER — Telehealth: Payer: Self-pay | Admitting: Genetic Counselor

## 2013-03-20 NOTE — Telephone Encounter (Signed)
Let the patient know that I had heard from [facility] and that her test result will be delayed by about 2 weeks.  We will call when the result is completed.

## 2013-03-21 ENCOUNTER — Ambulatory Visit
Admission: RE | Admit: 2013-03-21 | Discharge: 2013-03-21 | Disposition: A | Discharge status: Home / Self Care | Payer: Federal, State, Local not specified - PPO | Source: Ambulatory Visit | Attending: Internal Medicine | Admitting: Internal Medicine

## 2013-03-21 DIAGNOSIS — Z780 Asymptomatic menopausal state: Secondary | ICD-10-CM

## 2013-04-01 ENCOUNTER — Telehealth: Payer: Self-pay | Admitting: Genetic Counselor

## 2013-04-01 ENCOUNTER — Encounter: Payer: Self-pay | Admitting: Genetic Counselor

## 2013-04-01 NOTE — Telephone Encounter (Signed)
Revealed negative genetic test results with a PMS2 VUS.  Discussed whether she wants to pursue family studies.  She will think about it.

## 2013-04-01 NOTE — Telephone Encounter (Signed)
Left message at home and on mobile with good news.  Asked that she call me back.

## 2013-04-11 ENCOUNTER — Telehealth: Payer: Self-pay | Admitting: Genetic Counselor

## 2013-04-11 NOTE — Telephone Encounter (Signed)
Patient had question about bill from [place].  I gave her [facility] phone number.

## 2013-04-24 ENCOUNTER — Other Ambulatory Visit: Payer: Self-pay | Admitting: Obstetrics and Gynecology

## 2013-04-25 NOTE — Telephone Encounter (Signed)
S/W patient she says that Dr. Edward Jolly has given her a written rx and that she will call us back if she needs a refill

## 2013-07-09 ENCOUNTER — Other Ambulatory Visit: Payer: Self-pay | Admitting: Internal Medicine

## 2013-07-10 ENCOUNTER — Telehealth: Payer: Self-pay | Admitting: Internal Medicine

## 2013-07-10 NOTE — Telephone Encounter (Signed)
Refilled Ativan 0.5 mg (#90) by phone to [facility]  She has booked physical examination 09/03/2013.

## 2013-07-15 ENCOUNTER — Other Ambulatory Visit: Payer: Self-pay | Admitting: Internal Medicine

## 2013-08-22 ENCOUNTER — Ambulatory Visit (INDEPENDENT_AMBULATORY_CARE_PROVIDER_SITE_OTHER): Payer: Federal, State, Local not specified - PPO | Admitting: Family Medicine

## 2013-08-22 VITALS — BP 122/71 | HR 77 | Temp 99.4°F | Resp 18 | Ht 67.0 in | Wt 160.0 lb

## 2013-08-22 DIAGNOSIS — J329 Chronic sinusitis, unspecified: Secondary | ICD-10-CM

## 2013-08-22 MED ORDER — HYDROCOD POLST-CHLORPHEN POLST 10-8 MG/5ML PO LQCR: 5.0000 mL | Freq: Two times a day (BID) | ORAL | Status: DC | PRN

## 2013-08-22 MED ORDER — IPRATROPIUM BROMIDE 0.03 % NA SOLN: 2.0000 | Freq: Four times a day (QID) | NASAL | Status: DC

## 2013-08-22 MED ORDER — AMOXICILLIN-POT CLAVULANATE 875-125 MG PO TABS: 1.0000 | ORAL_TABLET | Freq: Two times a day (BID) | ORAL | Status: DC

## 2013-08-22 NOTE — Patient Instructions (Addendum)
Hot showers or breathing in steam may help loosen the congestion.  Using a netti pot or sinus rinse is also likely to help you feel better and keep this from progressing.  Use the atrovent nasal spray as needed throughout the day and use the veramyst nasal spray every night before bed for at least 2 weeks.  I recommend augmenting with 12 hr sudafed (behind the counter) and generic mucinex to help you move out the congestion.  If no improvement or you are getting worse, come back as you might need a course of steroids but hopefully with all of the above, you can avoid it.   Sinusitis Sinusitis is redness, soreness, and swelling (inflammation) of the paranasal sinuses. Paranasal sinuses are air pockets within the bones of your face (beneath the eyes, the middle of the forehead, or above the eyes). In healthy paranasal sinuses, mucus is able to drain out, and air is able to circulate through them by way of your nose. However, when your paranasal sinuses are inflamed, mucus and air can become trapped. This can allow bacteria and other germs to grow and cause infection. Sinusitis can develop quickly and last only a short time (acute) or continue over a long period (chronic). Sinusitis that lasts for more than 12 weeks is considered chronic.  CAUSES  Causes of sinusitis include:  Allergies.  Structural abnormalities, such as displacement of the cartilage that separates your nostrils (deviated septum), which can decrease the air flow through your nose and sinuses and affect sinus drainage.  Functional abnormalities, such as when the small hairs (cilia) that line your sinuses and help remove mucus do not work properly or are not present. SYMPTOMS  Symptoms of acute and chronic sinusitis are the same. The primary symptoms are pain and pressure around the affected sinuses. Other symptoms include:  Upper toothache.  Earache.  Headache.  Bad breath.  Decreased sense of smell and taste.  A cough, which  worsens when you are lying flat.  Fatigue.  Fever.  Thick drainage from your nose, which often is green and may contain pus (purulent).  Swelling and warmth over the affected sinuses. DIAGNOSIS  Your caregiver will perform a physical exam. During the exam, your caregiver may:  Look in your nose for signs of abnormal growths in your nostrils (nasal polyps).  Tap over the affected sinus to check for signs of infection.  View the inside of your sinuses (endoscopy) with a special imaging device with a light attached (endoscope), which is inserted into your sinuses. If your caregiver suspects that you have chronic sinusitis, one or more of the following tests may be recommended:  Allergy tests.  Nasal culture A sample of mucus is taken from your nose and sent to a lab and screened for bacteria.  Nasal cytology A sample of mucus is taken from your nose and examined by your caregiver to determine if your sinusitis is related to an allergy. TREATMENT  Most cases of acute sinusitis are related to a viral infection and will resolve on their own within 10 days. Sometimes medicines are prescribed to help relieve symptoms (pain medicine, decongestants, nasal steroid sprays, or saline sprays).  However, for sinusitis related to a bacterial infection, your caregiver will prescribe antibiotic medicines. These are medicines that will help kill the bacteria causing the infection.  Rarely, sinusitis is caused by a fungal infection. In theses cases, your caregiver will prescribe antifungal medicine. For some cases of chronic sinusitis, surgery is needed. Generally, these are  cases in which sinusitis recurs more than 3 times per year, despite other treatments. HOME CARE INSTRUCTIONS   Drink plenty of water. Water helps thin the mucus so your sinuses can drain more easily.  Use a humidifier.  Inhale steam 3 to 4 times a day (for example, sit in the bathroom with the shower running).  Apply a warm,  moist washcloth to your face 3 to 4 times a day, or as directed by your caregiver.  Use saline nasal sprays to help moisten and clean your sinuses.  Take over-the-counter or prescription medicines for pain, discomfort, or fever only as directed by your caregiver. SEEK IMMEDIATE MEDICAL CARE IF:  You have increasing pain or severe headaches.  You have nausea, vomiting, or drowsiness.  You have swelling around your face.  You have vision problems.  You have a stiff neck.  You have difficulty breathing. MAKE SURE YOU:   Understand these instructions.  Will watch your condition.  Will get help right away if you are not doing well or get worse. Document Released: 08/15/2005 Document Revised: 11/07/2011 Document Reviewed: 08/30/2011 [facility] Patient Information 2014 [place], [place].

## 2013-08-22 NOTE — Progress Notes (Signed)
Subjective:    Patient ID: [name], female    DOB: 1946/07/16, 67 y.o.   MRN: [id number] Chief Complaint  Patient presents with  . URI    2 weeks    HPI  Had URI sxs for 2 1/2 wks prior but recovered but never back to baseline - fatigue, sinus congestion, then this morning work up feeling distinctly worse.  Has had a cough - infrequent but severe, dry, worse at night.  Has been using saline spray and veramist but not sig rhinitis.  Left ear pain but no pain maxillary area. Mild sorethroat but + adenopathy, not achey.  Has been febrile today w/ diaphoresis.  Has been using some otc sudafed due to ETD prob contribuing to dizziness which she been able to avoid. No HA. No known sick contacts.  Past Medical History  Diagnosis Date  . Cancer     breast  . Rhinitis, nonallergic   . Hyperlipidemia   . Osteopenia   . Chronic UTI   . Eustachian tube dysfunction   . Dyspareunia   . Breast cancer 1993  . Allergy   . Arthritis    Current Outpatient Prescriptions on File Prior to Visit  Medication Sig Dispense Refill  . aspirin 81 MG tablet Take 81 mg by mouth daily.        Marland Kitchen atorvastatin (LIPITOR) 10 MG tablet Take 1 tablet (10 mg total) by mouth daily.  90 tablet  3  . calcium carbonate 200 MG capsule Take 250 mg by mouth 2 (two) times daily with a meal.      . cetirizine (ZYRTEC) 10 MG tablet Take 10 mg by mouth daily.        . Cranberry 250 MG TABS Take by mouth.        . estradiol (ESTRING) 2 MG vaginal ring Place 2 mg vaginally every 3 (three) months. follow package directions  1 each  0  . fish oil-omega-3 fatty acids 1000 MG capsule Take 1 g by mouth 2 times daily at 12 noon and 4 pm.        . LORazepam (ATIVAN) 0.5 MG tablet Take 0.5 mg by mouth every 8 (eight) hours.       . VERAMYST 27.5 MCG/SPRAY nasal spray USE 1 SPRAY IN EACH NOSTRIL DAILY  10 g  4  . zolpidem (AMBIEN) 10 MG tablet Take 1 tablet (10 mg total) by mouth at bedtime as needed.  30 tablet  5   No current  facility-administered medications on file prior to visit.   Allergies  Allergen Reactions  . Macrodantin     Review of Systems  Constitutional: Positive for fever, chills, diaphoresis, activity change, appetite change and fatigue.  HENT: Positive for congestion, ear pain, sinus pressure, sneezing and sore throat. Negative for dental problem, ear discharge, nosebleeds, postnasal drip, rhinorrhea, trouble swallowing and voice change.   Eyes: Negative for discharge and itching.  Respiratory: Positive for cough. Negative for shortness of breath.   Cardiovascular: Negative for chest pain.  Gastrointestinal: Negative for nausea, vomiting and abdominal pain.  Musculoskeletal: Negative for myalgias, neck pain and neck stiffness.  Skin: Negative for rash.  Neurological: Negative for dizziness, syncope, light-headedness and headaches.  Hematological: Positive for adenopathy.  Psychiatric/Behavioral: Positive for sleep disturbance.      BP 122/71  Pulse 77  Temp(Src) 99.4 F (37.4 C) (Oral)  Resp 18  Ht 5\' 7"  (1.702 m)  Wt 160 lb (72.576 kg)  BMI 25.05 kg/m2  SpO2 98% Objective:   Physical Exam  Constitutional: She is oriented to person, place, and time. She appears well-developed and well-nourished. She does not appear ill. No distress.  HENT:  Head: Normocephalic and atraumatic.  Right Ear: External ear and ear canal normal. Tympanic membrane is retracted. A middle ear effusion is present.  Left Ear: External ear and ear canal normal. Tympanic membrane is retracted. A middle ear effusion is present.  Nose: Mucosal edema and rhinorrhea present. Right sinus exhibits maxillary sinus tenderness. Left sinus exhibits maxillary sinus tenderness.  Mouth/Throat: Uvula is midline and mucous membranes are normal. Posterior oropharyngeal erythema present. No oropharyngeal exudate, posterior oropharyngeal edema or tonsillar abscesses.  Eyes: Conjunctivae are normal. Right eye exhibits no discharge.  Left eye exhibits no discharge. No scleral icterus.  Neck: Normal range of motion. Neck supple.  Cardiovascular: Normal rate, regular rhythm, normal heart sounds and intact distal pulses.   Pulmonary/Chest: Effort normal and breath sounds normal.  Lymphadenopathy:       Head (right side): Submandibular adenopathy present. No preauricular and no posterior auricular adenopathy present.       Head (left side): Submandibular adenopathy present. No preauricular and no posterior auricular adenopathy present.    She has no cervical adenopathy.       Right: No supraclavicular adenopathy present.       Left: No supraclavicular adenopathy present.  Neurological: She is alert and oriented to person, place, and time.  Skin: Skin is warm and dry. She is not diaphoretic. No erythema.  Psychiatric: She has a normal mood and affect. Her behavior is normal.          Assessment & Plan:  Sinus infection  Meds ordered this encounter  Medications  . amoxicillin-clavulanate (AUGMENTIN) 875-125 MG per tablet    Sig: Take 1 tablet by mouth 2 (two) times daily.    Dispense:  20 tablet    Refill:  0  . chlorpheniramine-HYDROcodone (TUSSIONEX PENNKINETIC ER) 10-8 MG/5ML LQCR    Sig: Take 5 mLs by mouth every 12 (twelve) hours as needed for cough (cough).    Dispense:  120 mL    Refill:  0  . ipratropium (ATROVENT) 0.03 % nasal spray    Sig: Place 2 sprays into the nose 4 (four) times daily.    Dispense:  30 mL    Refill:  1    Norberto Sorenson, MD MPH

## 2013-09-02 ENCOUNTER — Other Ambulatory Visit: Payer: Federal, State, Local not specified - PPO | Admitting: Internal Medicine

## 2013-09-02 ENCOUNTER — Encounter: Payer: Self-pay | Admitting: Internal Medicine

## 2013-09-02 ENCOUNTER — Ambulatory Visit (INDEPENDENT_AMBULATORY_CARE_PROVIDER_SITE_OTHER): Payer: Federal, State, Local not specified - PPO | Admitting: Internal Medicine

## 2013-09-02 VITALS — BP 106/70 | HR 80 | Temp 99.8°F | Wt 158.0 lb

## 2013-09-02 DIAGNOSIS — R112 Nausea with vomiting, unspecified: Secondary | ICD-10-CM

## 2013-09-02 DIAGNOSIS — H6592 Unspecified nonsuppurative otitis media, left ear: Secondary | ICD-10-CM

## 2013-09-02 DIAGNOSIS — H6590 Unspecified nonsuppurative otitis media, unspecified ear: Secondary | ICD-10-CM

## 2013-09-02 MED ORDER — AZITHROMYCIN 250 MG PO TABS: ORAL_TABLET | ORAL | Status: DC

## 2013-09-02 MED ORDER — PROMETHAZINE HCL 25 MG PO TABS: 25.0000 mg | ORAL_TABLET | Freq: Three times a day (TID) | ORAL | Status: DC | PRN

## 2013-09-02 NOTE — Progress Notes (Signed)
   Subjective:    Patient ID: [name], female    DOB: [birth year], 68 y.o.   MRN: [id number]  HPI  Patient went to urgent care on Christmas Day with respiratory infection and sinusitis. Ears were bothering her. She was placed on amoxicillin for 10 days which she just finished yesterday. Says ears still do not feel well. She had a low-grade fever at that time. Yesterday, she felt nauseated and vomited once. No further vomiting. Says she has a history of left ear problems from time to time. She was scheduled for physical exam tomorrow but we have deferred that until February. She has had influenza vaccine. No diarrhea. No shaking chills. No myalgias.    Review of Systems     Objective:   Physical Exam right TM is clear. Left TM is full with a splayed light reflex but not red. Pharynx is clear. Neck is supple. Chest clear to auscultation.        Assessment & Plan:  Left serous otitis media  Plan: Recommend Dayquil for 10 days. Phenergan 25 mg tablets #20 by mouth Q8 hours when necessary nausea. Zithromax Z-PAK take 2 tablets day one followed by 1 tablet days 2 through 5. Physical exam rescheduled for February 25 with labs

## 2013-09-02 NOTE — Patient Instructions (Addendum)
Take Zithromax Z-PAK as directed. Physical exam rescheduled for February 2015. Take Phenergan as needed for nausea. Take Dayquil for left serous otitis media

## 2013-09-03 ENCOUNTER — Encounter: Payer: Federal, State, Local not specified - PPO | Admitting: Internal Medicine

## 2013-09-22 ENCOUNTER — Ambulatory Visit (INDEPENDENT_AMBULATORY_CARE_PROVIDER_SITE_OTHER): Payer: Federal, State, Local not specified - PPO | Admitting: Internal Medicine

## 2013-09-22 VITALS — BP 120/80 | HR 69 | Temp 98.4°F | Resp 16 | Ht 67.0 in | Wt 163.0 lb

## 2013-09-22 DIAGNOSIS — J329 Chronic sinusitis, unspecified: Secondary | ICD-10-CM

## 2013-09-22 MED ORDER — AMOXICILLIN-POT CLAVULANATE 875-125 MG PO TABS: 1.0000 | ORAL_TABLET | Freq: Two times a day (BID) | ORAL | Status: DC

## 2013-09-22 NOTE — Progress Notes (Addendum)
Subjective:    Patient ID: [name], female    DOB: 06-Aug-1946, 68 y.o.   MRN: [id number]  HPI Chief Complaint  Patient presents with  . Ear Fullness    (L) ear x 1 month    This chart was scribed for Tami Lin, MD by Thea Alken, ED Scribe. This patient was seen in room 4 and the patient's care was started at 2:47 PM.  HPI Comments: [name] is a 68 y.o. female who presents to the [facility] complaining of dull, left ear pain onset 1 month. She reports that she has gotten better from the sinusitis but her left ear has fullness. She denies any trouble hearing, popping, or ringing in her left ear. She is also complain of swollen glands. Pt has h/o of left ear problems.   Pt was seen one month ago by Dr. Brigitte Pulse for URI/sinusitis-amox. Incompl response with incr cough so  She was also seen by PCP Dr. Renold Genta 3 weeks ago-zithro Still with sinus and L ear congestion.  Past Medical History  Diagnosis Date  . Cancer     breast  . Rhinitis, nonallergic   . Hyperlipidemia   . Osteopenia   . Chronic UTI   . Eustachian tube dysfunction   . Dyspareunia   . Breast cancer 1993  . Allergy   . Arthritis    Allergies  Allergen Reactions  . Macrodantin    Prior to Admission medications   Medication Sig Start Date End Date Taking? Authorizing Provider  aspirin 81 MG tablet Take 81 mg by mouth daily.     Yes Historical Provider, MD  atorvastatin (LIPITOR) 10 MG tablet Take 1 tablet (10 mg total) by mouth daily. 11/30/12  Yes Elby Showers, MD  calcium carbonate 200 MG capsule Take 250 mg by mouth 2 (two) times daily with a meal.   Yes Historical Provider, MD  cetirizine (ZYRTEC) 10 MG tablet Take 10 mg by mouth daily.     Yes Historical Provider, MD  Cranberry 250 MG TABS Take by mouth.     Yes Historical Provider, MD  estradiol (ESTRING) 2 MG vaginal ring Place 2 mg vaginally every 3 (three) months. follow package directions 12/19/12  Yes Forsan  Berton Lan, MD  fish oil-omega-3 fatty acids 1000 MG capsule Take 1 g by mouth 2 times daily at 12 noon and 4 pm.     Yes Historical Provider, MD  ipratropium (ATROVENT) 0.03 % nasal spray Place 2 sprays into the nose 4 (four) times daily. 08/22/13  Yes Shawnee Knapp, MD  LORazepam (ATIVAN) 0.5 MG tablet Take 0.5 mg by mouth every 8 (eight) hours.    Yes Historical Provider, MD  VERAMYST 27.5 MCG/SPRAY nasal spray USE 1 SPRAY IN EACH NOSTRIL DAILY 12/24/12  Yes Elby Showers, MD  zolpidem (AMBIEN) 10 MG tablet Take 1 tablet (10 mg total) by mouth at bedtime as needed. 02/01/13  Yes Elby Showers, MD  promethazine (PHENERGAN) 25 MG tablet Take 1 tablet (25 mg total) by mouth every 8 (eight) hours as needed for nausea or vomiting. 09/02/13   Elby Showers, MD    Review of Systems  Constitutional: Negative for fever and chills.  HENT: Positive for ear pain ( left), postnasal drip, rhinorrhea and sinus pressure. Negative for dental problem, hearing loss, sore throat and tinnitus.   Respiratory: Negative for cough.        Objective:  Physical Exam  Constitutional: She appears well-developed and well-nourished.  HENT:  Right Ear: External ear normal.  L ear sl dull pars flaccida but not red or immobile  Nares boggy w/ incr purul R Mildly tend max areas to perc  Eyes: Conjunctivae are normal. Pupils are equal, round, and reactive to light.  Neck: No thyromegaly present.  Cardiovascular: Normal rate and regular rhythm.   Pulmonary/Chest: Effort normal and breath sounds normal.  Lymphadenopathy:    She has no cervical adenopathy.         Assessment & Plan:  I have completed the patient encounter in its entirety as documented by the scribe, with editing by me where necessary. Ashelyn Mccravy P. Laney Pastor, M.D.  Refractory sinusitis  Meds ordered this encounter  Medications  . amoxicillin-clavulanate (AUGMENTIN) 875-125 MG per tablet    Sig: Take 1 tablet by mouth 2 (two) times daily.     Dispense:  20 tablet    Refill:  0   Afrin L nostril supine for ear congestion daily at hs 5 days

## 2013-09-26 ENCOUNTER — Other Ambulatory Visit: Payer: Federal, State, Local not specified - PPO | Admitting: Internal Medicine

## 2013-09-26 DIAGNOSIS — E785 Hyperlipidemia, unspecified: Secondary | ICD-10-CM

## 2013-09-26 DIAGNOSIS — Z13228 Encounter for screening for other metabolic disorders: Secondary | ICD-10-CM

## 2013-09-26 DIAGNOSIS — Z130 Encounter for screening for diseases of the blood and blood-forming organs and certain disorders involving the immune mechanism: Secondary | ICD-10-CM

## 2013-09-26 DIAGNOSIS — Z79899 Other long term (current) drug therapy: Secondary | ICD-10-CM

## 2013-09-26 DIAGNOSIS — Z1329 Encounter for screening for other suspected endocrine disorder: Secondary | ICD-10-CM

## 2013-09-26 LAB — COMPREHENSIVE METABOLIC PANEL
ALT: 34 U/L (ref 0–35)
AST: 33 U/L (ref 0–37)
Albumin: 4.4 g/dL (ref 3.5–5.2)
Alkaline Phosphatase: 89 U/L (ref 39–117)
BILIRUBIN TOTAL: 0.7 mg/dL (ref 0.2–1.2)
BUN: 12 mg/dL (ref 6–23)
CO2: 30 meq/L (ref 19–32)
CREATININE: 0.64 mg/dL (ref 0.50–1.10)
Calcium: 9.3 mg/dL (ref 8.4–10.5)
Chloride: 99 mEq/L (ref 96–112)
Glucose, Bld: 87 mg/dL (ref 70–99)
Potassium: 4.1 mEq/L (ref 3.5–5.3)
Sodium: 136 mEq/L (ref 135–145)
Total Protein: 6.5 g/dL (ref 6.0–8.3)

## 2013-09-26 LAB — LIPID PANEL
CHOL/HDL RATIO: 2.6 ratio
CHOLESTEROL: 160 mg/dL (ref 0–200)
HDL: 61 mg/dL (ref >39)
LDL Cholesterol: 80 mg/dL (ref 0–99)
TRIGLYCERIDES: 95 mg/dL (ref <150)
VLDL: 19 mg/dL (ref 0–40)

## 2013-09-26 LAB — CBC WITH DIFFERENTIAL/PLATELET
Basophils Absolute: 0 10*3/uL (ref 0.0–0.1)
Basophils Relative: 0 % (ref 0–1)
EOS ABS: 0.1 10*3/uL (ref 0.0–0.7)
EOS PCT: 2 % (ref 0–5)
HCT: 41.9 % (ref 36.0–46.0)
Hemoglobin: 14.4 g/dL (ref 12.0–15.0)
LYMPHS ABS: 1.9 10*3/uL (ref 0.7–4.0)
Lymphocytes Relative: 39 % (ref 12–46)
MCH: 29.8 pg (ref 26.0–34.0)
MCHC: 34.4 g/dL (ref 30.0–36.0)
MCV: 86.6 fL (ref 78.0–100.0)
Monocytes Absolute: 0.3 10*3/uL (ref 0.1–1.0)
Monocytes Relative: 6 % (ref 3–12)
Neutro Abs: 2.6 10*3/uL (ref 1.7–7.7)
Neutrophils Relative %: 53 % (ref 43–77)
PLATELETS: 207 10*3/uL (ref 150–400)
RBC: 4.84 MIL/uL (ref 3.87–5.11)
RDW: 13.3 % (ref 11.5–15.5)
WBC: 4.8 10*3/uL (ref 4.0–10.5)

## 2013-09-26 LAB — TSH: TSH: 3.842 u[IU]/mL (ref 0.350–4.500)

## 2013-09-27 ENCOUNTER — Other Ambulatory Visit: Payer: Federal, State, Local not specified - PPO | Admitting: Internal Medicine

## 2013-09-27 LAB — VITAMIN D 25 HYDROXY (VIT D DEFICIENCY, FRACTURES): Vit D, 25-Hydroxy: 39 ng/mL (ref 30–89)

## 2013-09-30 ENCOUNTER — Ambulatory Visit (INDEPENDENT_AMBULATORY_CARE_PROVIDER_SITE_OTHER): Payer: Federal, State, Local not specified - PPO | Admitting: Internal Medicine

## 2013-09-30 ENCOUNTER — Encounter: Payer: Self-pay | Admitting: Internal Medicine

## 2013-09-30 VITALS — BP 108/74 | HR 80 | Temp 98.3°F | Ht 67.75 in | Wt 160.0 lb

## 2013-09-30 DIAGNOSIS — H6592 Unspecified nonsuppurative otitis media, left ear: Secondary | ICD-10-CM

## 2013-09-30 DIAGNOSIS — H6590 Unspecified nonsuppurative otitis media, unspecified ear: Secondary | ICD-10-CM

## 2013-09-30 DIAGNOSIS — Z853 Personal history of malignant neoplasm of breast: Secondary | ICD-10-CM

## 2013-09-30 DIAGNOSIS — H6520 Chronic serous otitis media, unspecified ear: Secondary | ICD-10-CM

## 2013-09-30 DIAGNOSIS — M949 Disorder of cartilage, unspecified: Secondary | ICD-10-CM

## 2013-09-30 DIAGNOSIS — Z0000 Encounter for general adult medical examination without abnormal findings: Secondary | ICD-10-CM

## 2013-09-30 DIAGNOSIS — M8580 Other specified disorders of bone density and structure, unspecified site: Secondary | ICD-10-CM

## 2013-09-30 DIAGNOSIS — H6522 Chronic serous otitis media, left ear: Secondary | ICD-10-CM

## 2013-09-30 DIAGNOSIS — M899 Disorder of bone, unspecified: Secondary | ICD-10-CM

## 2013-09-30 LAB — POCT URINALYSIS DIPSTICK
BILIRUBIN UA: NEGATIVE
Glucose, UA: NEGATIVE
KETONES UA: NEGATIVE
Leukocytes, UA: NEGATIVE
Nitrite, UA: NEGATIVE
PH UA: 7
Protein, UA: NEGATIVE
RBC UA: NEGATIVE
Urobilinogen, UA: NEGATIVE

## 2013-09-30 MED ORDER — METHYLPREDNISOLONE ACETATE 80 MG/ML IJ SUSP
80.0000 mg | Freq: Once | INTRAMUSCULAR | Status: AC
  Administered 2013-09-30: 80 mg via INTRAMUSCULAR

## 2013-09-30 NOTE — Progress Notes (Signed)
   Subjective:    Patient ID: [name], female    DOB: Sep 21, 1945, 68 y.o.   MRN: [id number]  HPI 68 year old White female not on Medicare at this time in today for health maintenance and evaluation of medical issues. In osteopenia, anxiety. On December 25 was treated with Augmentin for respiratory infection., Subsequently received a Z-Pak  January 5th and then  Augmentin Jan 25th per Dr. Laney Pastor. Continues to have left ear discomfort.  Had bilateral mastectomies in 1993 by Dr. Margot Chimes and chemotherapy for breast cancer. History of lumbar disc disease L5-S1 with annular disc bulging and osteophytosis. Colonoscopy by Dr. Earlean Shawl 2014 History of eustachian tube dysfunction.  Patient had uterine polyp removed in 2010, fractured toe 2011, left frozen shoulder 1993.  Intolerant of Macrodantin  Social history: Patient formerly worked as a Materials engineer. Husband is an Systems developer judge for [employer]. She does not smoke. Social alcohol consumption. One daughter.  Family history: Mother died at age 19 of a stroke. No siblings.  Father with history of MI hypertension and stroke    Review of Systems  Constitutional: Negative.   HENT:       Persistent ear discomfort  Eyes: Negative.   Respiratory: Negative.   Cardiovascular: Negative.   Gastrointestinal: Negative.   Endocrine: Negative.   Genitourinary: Negative.   Neurological: Negative.   Hematological: Negative.   Psychiatric/Behavioral: Negative.        Objective:   Physical Exam  Vitals reviewed. Constitutional: She is oriented to person, place, and time. She appears well-developed and well-nourished. No distress.  HENT:  Head: Normocephalic and atraumatic.  Right Ear: External ear normal.  Left Ear: External ear normal.  Mouth/Throat: Oropharynx is clear and moist. No oropharyngeal exudate.  Left serous otitis  Eyes: Conjunctivae and EOM are normal. Pupils are equal, round,  and reactive to light. Right eye exhibits no discharge. Left eye exhibits no discharge.  Neck: Neck supple. No JVD present. No thyromegaly present.  Cardiovascular: Normal rate, regular rhythm and intact distal pulses.   No murmur heard. Pulmonary/Chest: Effort normal and breath sounds normal. No respiratory distress. She has no wheezes.  Status post bilateral mastectomies  Abdominal: Soft. Bowel sounds are normal. She exhibits no distension and no mass. There is no tenderness. There is no rebound and no guarding.  Genitourinary:  Deferred to GYN  Musculoskeletal: She exhibits no edema.  Lymphadenopathy:    She has no cervical adenopathy.  Neurological: She is alert and oriented to person, place, and time. She has normal reflexes. No cranial nerve deficit. Coordination normal.  Skin: Skin is warm and dry. No rash noted. She is not diaphoretic. No erythema. No pallor.  Psychiatric: She has a normal mood and affect. Her behavior is normal. Judgment and thought content normal.          Assessment & Plan:  History of breast cancer 1993 status post lateral mastectomies and chemotherapy  History of nonallergic rhinitis  Hyperlipidemia-stable on statin  History of osteopenia-suggest endocrine consultation regarding possible bisphosphonate therapy  History of eustachian tube dysfunction  History of recurrent urinary infection  History of insomnia-treated with Ativan  Left serous otitis media-persistent  Plan: Continue statin medication.  Depo-Medrol for serous otitis media. To get Pneumovax immunization in 2 weeks. Order given for Zostavax vaccine. Endocrine consultation regarding bisphosphonate therapy

## 2013-09-30 NOTE — Patient Instructions (Addendum)
Depomedrol given. To get pneumovax in 2 weeks.  Order given for Zostavax. Endocrinology consult regarding bisphosphonate therapy for osteopenia.

## 2013-10-18 ENCOUNTER — Ambulatory Visit (INDEPENDENT_AMBULATORY_CARE_PROVIDER_SITE_OTHER): Payer: Federal, State, Local not specified - PPO | Admitting: Internal Medicine

## 2013-10-18 DIAGNOSIS — Z23 Encounter for immunization: Secondary | ICD-10-CM

## 2013-11-12 ENCOUNTER — Ambulatory Visit: Payer: Federal, State, Local not specified - PPO | Admitting: Internal Medicine

## 2013-11-15 ENCOUNTER — Encounter: Payer: Self-pay | Admitting: Internal Medicine

## 2013-11-15 ENCOUNTER — Ambulatory Visit (INDEPENDENT_AMBULATORY_CARE_PROVIDER_SITE_OTHER): Payer: Federal, State, Local not specified - PPO | Admitting: Internal Medicine

## 2013-11-15 VITALS — BP 126/72 | HR 80 | Temp 98.2°F | Wt 160.0 lb

## 2013-11-15 DIAGNOSIS — J069 Acute upper respiratory infection, unspecified: Secondary | ICD-10-CM

## 2013-11-15 MED ORDER — LEVOFLOXACIN 500 MG PO TABS: 500.0000 mg | ORAL_TABLET | Freq: Every day | ORAL | Status: DC

## 2013-11-15 NOTE — Patient Instructions (Addendum)
Take Levaquin 500 mg daily x 10 days with food. Continue Dayquil.

## 2013-11-15 NOTE — Progress Notes (Signed)
   Subjective:    Patient ID: [name], female    DOB: 1945/10/29, 68 y.o.   MRN: [id number]  HPI  68 year old female with URI symptoms. Went swimming in [employer] with [place] near [place], [place] on sat March 14. Developed laryngitis. Slight cough. No fever. Taking Dayquil. Sounds hoarse and nasally congested. Had sinus infection in late December and early January treated in urgent care with Augmentin twice. Subsequently came here and was given an injection of Depo-Medrol in early February and improved. History of allergic rhinitis. Uses Veramyst and takes Zyrtec. Has 36-month-old grandchild that has frequent respiratory infections and is in daycare.    Review of Systems     Objective:   Physical Exam   Pharynx is very slightly injected. TMs are clear bilaterally. Neck supple. Chest clear. Sounds hoarse.       Assessment & Plan:  Acute URI  History of sinusitis  Allergic rhinitis  Plan: Levaquin 500 milligrams daily for 10 days. If not better in 10 days, please call back. May take DayQuil for congestion.

## 2013-11-18 ENCOUNTER — Telehealth: Payer: Self-pay | Admitting: Internal Medicine

## 2013-11-18 ENCOUNTER — Other Ambulatory Visit: Payer: Self-pay | Admitting: Internal Medicine

## 2013-11-18 ENCOUNTER — Other Ambulatory Visit: Payer: Self-pay

## 2013-11-18 MED ORDER — ZOLPIDEM TARTRATE 10 MG PO TABS: 10.0000 mg | ORAL_TABLET | Freq: Every evening | ORAL | Status: DC | PRN

## 2013-11-18 NOTE — Telephone Encounter (Signed)
Please call in #[phone] with no refill

## 2013-11-18 NOTE — Telephone Encounter (Signed)
Not sure what is causing heartburn but need to treat it as acid reflux. Take Zantac 150 mg twice daily for heartburn. Can take Ambien for sleep if having insomnia not heartburn

## 2013-11-18 NOTE — Telephone Encounter (Signed)
Patient informed. She will try to take the whole course of Levaquin, but doesn't want to take something for reflux everyday. Will refill Ambien.

## 2013-12-02 ENCOUNTER — Ambulatory Visit: Payer: Federal, State, Local not specified - PPO | Admitting: Internal Medicine

## 2013-12-24 ENCOUNTER — Encounter: Payer: Self-pay | Admitting: Internal Medicine

## 2013-12-24 ENCOUNTER — Ambulatory Visit (INDEPENDENT_AMBULATORY_CARE_PROVIDER_SITE_OTHER): Payer: Federal, State, Local not specified - PPO | Admitting: Internal Medicine

## 2013-12-24 VITALS — BP 118/60 | HR 71 | Temp 99.4°F | Ht 67.0 in | Wt 155.5 lb

## 2013-12-24 DIAGNOSIS — H6592 Unspecified nonsuppurative otitis media, left ear: Secondary | ICD-10-CM

## 2013-12-24 DIAGNOSIS — J029 Acute pharyngitis, unspecified: Secondary | ICD-10-CM

## 2013-12-24 DIAGNOSIS — J069 Acute upper respiratory infection, unspecified: Secondary | ICD-10-CM

## 2013-12-24 DIAGNOSIS — H6590 Unspecified nonsuppurative otitis media, unspecified ear: Secondary | ICD-10-CM

## 2013-12-24 LAB — POCT RAPID STREP A (OFFICE): Rapid Strep A Screen: NEGATIVE

## 2013-12-24 MED ORDER — CLARITHROMYCIN 500 MG PO TABS: 500.0000 mg | ORAL_TABLET | Freq: Two times a day (BID) | ORAL | Status: DC

## 2013-12-24 MED ORDER — BENZONATATE 100 MG PO CAPS: 200.0000 mg | ORAL_CAPSULE | Freq: Three times a day (TID) | ORAL | Status: DC | PRN

## 2013-12-24 NOTE — Progress Notes (Signed)
   Subjective:    Patient ID: [name], female    DOB: October 27, 1945, 68 y.o.   MRN: [id number]  HPI Patient was exposed to respiratory infection recently by a grandchild. She has sore throat which is rather severe and discolored sputum production. Does not feel she completely got over last respiratory infection either. Some issues with allergic rhinitis. No fever or chills. Some cough.    Review of Systems     Objective:   Physical Exam Pharynx is red. Rapid strep screen negative. Left TM is full but not red. Right TM clear. Neck is supple without significant adenopathy. Chest clear to auscultation.        Assessment & Plan:  Acute URI  Pharyngitis  Plan: Patient took Levaquin at last visit. Prescribed today Biaxin 500 mg twice daily for 10 days. Tessalon Perles 200 mg 3 times a day when necessary cough.

## 2013-12-24 NOTE — Patient Instructions (Addendum)
Take Biaxin 500 mg twice daily x 10 days. Tessalon perles 200 mg 3 times daily for cough.

## 2013-12-31 ENCOUNTER — Ambulatory Visit (INDEPENDENT_AMBULATORY_CARE_PROVIDER_SITE_OTHER): Payer: Federal, State, Local not specified - PPO | Admitting: Internal Medicine

## 2013-12-31 ENCOUNTER — Encounter: Payer: Self-pay | Admitting: Internal Medicine

## 2013-12-31 VITALS — BP 112/78 | HR 84 | Temp 99.2°F | Wt 155.0 lb

## 2013-12-31 DIAGNOSIS — J069 Acute upper respiratory infection, unspecified: Secondary | ICD-10-CM

## 2013-12-31 MED ORDER — METHYLPREDNISOLONE ACETATE 80 MG/ML IJ SUSP
80.0000 mg | Freq: Once | INTRAMUSCULAR | Status: AC
  Administered 2013-12-31: 80 mg via INTRAMUSCULAR

## 2013-12-31 NOTE — Patient Instructions (Addendum)
Finish antibiotics. Depo-Medrol 80 mg IM given today for congestion.

## 2013-12-31 NOTE — Progress Notes (Signed)
   Subjective:    Patient ID: [name], female    DOB: [birth year], 68 y.o.   MRN: [id number]  HPI Patient was seen here April 28 with acute upper respiratory infection. She was treated with a ten-day course of Biaxin 500 mg twice daily. In the meantime, her father became ill and was hospitalized in [facility] with a severe respiratory infection. She's been going down there daily and has gotten rundown. She had a lot of green sinus discharge but says that has improved considerably. She still remains hoarse. No fever or shaking chills. She still has 2 days of Biaxin left. Explained to her that it may be several more days  getting over this as we have had a lot of viral respiratory infections recently that are protracted in course.    Review of Systems     Objective:   Physical Exam She sounds hoarse when she speaks. Pharynx is clear. TMs are clear. Neck is supple. Chest clear to auscultation without rales or wheezing        Assessment & Plan:  Acute respiratory infection  Plan: Biaxin 500 mg twice daily for 10 days was started April 28. Need to complete ten-day course. Depo-Medrol 80 mg IM given in office today. She has Tessalon Perles if needed for cough.

## 2014-01-09 ENCOUNTER — Other Ambulatory Visit: Payer: Self-pay | Admitting: Internal Medicine

## 2014-01-09 NOTE — Telephone Encounter (Signed)
Refill x 6 months 

## 2014-01-13 ENCOUNTER — Telehealth: Payer: Self-pay | Admitting: Internal Medicine

## 2014-01-13 ENCOUNTER — Encounter: Payer: Self-pay | Admitting: Internal Medicine

## 2014-01-13 ENCOUNTER — Ambulatory Visit (INDEPENDENT_AMBULATORY_CARE_PROVIDER_SITE_OTHER): Payer: Federal, State, Local not specified - PPO | Admitting: Internal Medicine

## 2014-01-13 ENCOUNTER — Ambulatory Visit
Admission: RE | Admit: 2014-01-13 | Discharge: 2014-01-13 | Disposition: A | Discharge status: Home / Self Care | Payer: Federal, State, Local not specified - PPO | Source: Ambulatory Visit | Attending: Internal Medicine | Admitting: Internal Medicine

## 2014-01-13 VITALS — BP 110/82 | Temp 98.8°F | Wt 153.0 lb

## 2014-01-13 DIAGNOSIS — R059 Cough, unspecified: Secondary | ICD-10-CM

## 2014-01-13 DIAGNOSIS — R05 Cough: Secondary | ICD-10-CM

## 2014-01-13 DIAGNOSIS — J45909 Unspecified asthma, uncomplicated: Secondary | ICD-10-CM

## 2014-01-13 DIAGNOSIS — J209 Acute bronchitis, unspecified: Secondary | ICD-10-CM

## 2014-01-13 MED ORDER — PREDNISONE 10 MG PO KIT: PACK | ORAL | Status: DC

## 2014-01-13 MED ORDER — HYDROCODONE-HOMATROPINE 5-1.5 MG/5ML PO SYRP: 5.0000 mL | ORAL_SOLUTION | Freq: Three times a day (TID) | ORAL | Status: DC | PRN

## 2014-01-13 NOTE — Telephone Encounter (Signed)
Needs CXR then OV has been going on since March/April

## 2014-01-13 NOTE — Telephone Encounter (Signed)
Left message with these instructions  

## 2014-01-13 NOTE — Telephone Encounter (Signed)
Patient called back.  Patient will go to [facility] before noon today to have CXR.  Then, she will come to our office this afternoon (5/18) at 3:45 in follow up.

## 2014-01-13 NOTE — Patient Instructions (Signed)
Take Hycodan as directed. Take prednisone in tapering course as directed.

## 2014-01-13 NOTE — Progress Notes (Signed)
   Subjective:    Patient ID: [name], female    DOB: [birth year], 68 y.o.   MRN: [id number]  HPI She is still coughing despite having been on Levaquin, Biaxin, and having received a shot of Depo-Medrol. We ordered chest x-ray  today and it is negative. She says she was allergy tested by Dr. Donneta Romberg around 2011 and had no significant allergies. However, there seems to be an allergic component to this protracted respiratory infection. Her cough has been productive but it is less so after the Depo-Medrol shot. No fever or shaking chills.    Review of Systems     Objective:   Physical Exam Slightly boggy nasal mucosa. TMs are clear. Neck is supple. Chest clear. Pharynx is clear.       Assessment & Plan:  Allergic bronchitis  Plan: Sterapred DS 10 mg 6 day dosepak. Hycodan 8 ounces 1 teaspoon by mouth every 6-8 hours when necessary cough. If cough does not clear, suggest allergy re-evaluation.

## 2014-02-04 ENCOUNTER — Other Ambulatory Visit: Payer: Self-pay | Admitting: Internal Medicine

## 2014-02-24 ENCOUNTER — Other Ambulatory Visit: Payer: Self-pay | Admitting: Obstetrics and Gynecology

## 2014-02-25 LAB — CYTOLOGY - PAP

## 2014-03-14 ENCOUNTER — Ambulatory Visit
Admission: RE | Admit: 2014-03-14 | Discharge: 2014-03-14 | Disposition: A | Discharge status: Home / Self Care | Payer: Federal, State, Local not specified - PPO | Source: Ambulatory Visit | Attending: Allergy | Admitting: Allergy

## 2014-03-14 ENCOUNTER — Other Ambulatory Visit: Payer: Self-pay | Admitting: Allergy

## 2014-03-14 DIAGNOSIS — J329 Chronic sinusitis, unspecified: Secondary | ICD-10-CM

## 2014-03-31 ENCOUNTER — Telehealth: Payer: Self-pay | Admitting: Internal Medicine

## 2014-03-31 ENCOUNTER — Ambulatory Visit
Admission: RE | Admit: 2014-03-31 | Discharge: 2014-03-31 | Disposition: A | Discharge status: Home / Self Care | Payer: Federal, State, Local not specified - PPO | Source: Ambulatory Visit | Attending: Internal Medicine | Admitting: Internal Medicine

## 2014-03-31 ENCOUNTER — Encounter: Payer: Self-pay | Admitting: Internal Medicine

## 2014-03-31 ENCOUNTER — Ambulatory Visit (INDEPENDENT_AMBULATORY_CARE_PROVIDER_SITE_OTHER): Payer: Federal, State, Local not specified - PPO | Admitting: Internal Medicine

## 2014-03-31 VITALS — BP 140/74 | HR 80 | Temp 98.7°F | Wt 153.0 lb

## 2014-03-31 DIAGNOSIS — S6992XA Unspecified injury of left wrist, hand and finger(s), initial encounter: Secondary | ICD-10-CM

## 2014-03-31 DIAGNOSIS — S59919A Unspecified injury of unspecified forearm, initial encounter: Secondary | ICD-10-CM

## 2014-03-31 DIAGNOSIS — S6990XA Unspecified injury of unspecified wrist, hand and finger(s), initial encounter: Secondary | ICD-10-CM

## 2014-03-31 DIAGNOSIS — S59909A Unspecified injury of unspecified elbow, initial encounter: Secondary | ICD-10-CM

## 2014-03-31 NOTE — Patient Instructions (Signed)
Appointment with hand surgeon to evaluate for possible left scaphoid fracture

## 2014-03-31 NOTE — Progress Notes (Signed)
   Subjective:    Patient ID: [name], female    DOB: 06/14/1946, 68 y.o.   MRN: [id number]  HPI  68 year old female fell about 9:00 this morning while playing tennis. Somehow lost her balance and fell backwards and attempted to catch herself with her left outstretched hand. Got up from the court, realized wrist was bruised but finished  tennis game. She went to an urgent care but there was a two-hour wait. She called here asking what to do. We proceeded to x-ray her left wrist at [facility] There is a possible left scaphoid fracture.    Review of Systems     Objective:   Physical Exam  She has considerable swelling and discoloration of the left lateral wrist. She can move all of her fingers.  She also has pain along left medial wrist and thumb with some mild swelling.      Assessment & Plan:  Possible left scaphoid fracture  Contusions left wrist and hand  Possible sprain left wrist  Plan: Appointment with hand surgeon for further evaluation. I might add she is on Avelox per Dr. Donneta Romberg for chronic sinusitis and has to take an additional 2 weeks of that medication to complete a four-week course. CT scan showed chronic sinusitis she says.

## 2014-03-31 NOTE — Telephone Encounter (Signed)
Fell playing tennis and has wrist injury. To get Xray and come to office.

## 2014-04-03 ENCOUNTER — Telehealth: Payer: Self-pay | Admitting: Internal Medicine

## 2014-04-03 NOTE — Telephone Encounter (Signed)
I spoke with Dr. Renold Genta; she advised she spoke with the patient at 11:30 p.m. On 04/02/14.  Advised patient to stop the medication and DO NOT take any more Benadryl.  Patient told Dr. Renold Genta she had a rash from the Avalox that Dr. Donneta Romberg put her on 2 weeks ago.  Rash on her neck and shoulders.  She took 75mg  of Benadryl.  Patient advised to STOP the medication and DO NOT take any additional Benadryl and give it 24 hours.    Patient called back to the office this a.m. @ 9:07 a.m.; advised same message.  Spoke with Dr. Renold Genta; she advised patient will need to contact Dr. Donneta Romberg since they prescribed the meds.  DO NOT take any  More Benadryl.    Spoke with patient @ [phone]; patient states that Dr. Rush Landmark office was referring her out to an ENT.  I instructed patient to call Dr. Rush Landmark office.  States she has been on the Avalox for 2 weeks.  And, rash is just now appearing?  Yes, per patient.  Advised patient that Dr. Donneta Romberg needs to know that the medication caused a reaction and she needs to contact that office for follow up regarding this issue since they prescribed the medication.  Again advised patient to NOT take any additional Benadryl.  Patient verbalized understanding of these instructions.

## 2014-05-08 ENCOUNTER — Ambulatory Visit: Payer: Federal, State, Local not specified - PPO | Admitting: Internal Medicine

## 2014-06-13 ENCOUNTER — Other Ambulatory Visit: Payer: Self-pay

## 2014-06-27 ENCOUNTER — Other Ambulatory Visit: Payer: Self-pay | Admitting: Internal Medicine

## 2014-06-30 ENCOUNTER — Encounter: Payer: Self-pay | Admitting: Internal Medicine

## 2014-07-09 ENCOUNTER — Other Ambulatory Visit: Payer: Self-pay | Admitting: Internal Medicine

## 2014-07-09 NOTE — Telephone Encounter (Signed)
Refill Ativan 0.5 mg #90 with one refill to [facility]. Needs physical exam February 2016.

## 2014-07-10 NOTE — Telephone Encounter (Signed)
[facility] into [facility].

## 2014-08-01 ENCOUNTER — Ambulatory Visit: Payer: Federal, State, Local not specified - PPO | Admitting: Internal Medicine

## 2014-08-06 ENCOUNTER — Encounter: Payer: Self-pay | Admitting: Internal Medicine

## 2014-08-06 NOTE — Progress Notes (Signed)
Patient received flu vaccine at [facility] some time in November 2015.

## 2014-09-09 ENCOUNTER — Encounter: Payer: Self-pay | Admitting: Internal Medicine

## 2014-09-09 ENCOUNTER — Ambulatory Visit (INDEPENDENT_AMBULATORY_CARE_PROVIDER_SITE_OTHER): Payer: Federal, State, Local not specified - PPO | Admitting: Internal Medicine

## 2014-09-09 VITALS — BP 108/76 | HR 92 | Temp 99.4°F | Wt 154.0 lb

## 2014-09-09 DIAGNOSIS — G4700 Insomnia, unspecified: Secondary | ICD-10-CM

## 2014-09-09 DIAGNOSIS — J329 Chronic sinusitis, unspecified: Secondary | ICD-10-CM

## 2014-09-09 DIAGNOSIS — H6502 Acute serous otitis media, left ear: Secondary | ICD-10-CM

## 2014-09-09 MED ORDER — LORAZEPAM 1 MG PO TABS: 1.0000 mg | ORAL_TABLET | Freq: Every day | ORAL | Status: DC

## 2014-09-09 MED ORDER — METHYLPREDNISOLONE ACETATE 80 MG/ML IJ SUSP
80.0000 mg | Freq: Once | INTRAMUSCULAR | Status: AC
  Administered 2014-09-09: 80 mg via INTRAMUSCULAR

## 2014-09-09 MED ORDER — AZITHROMYCIN 250 MG PO TABS: ORAL_TABLET | ORAL | Status: DC

## 2014-09-09 NOTE — Progress Notes (Signed)
   Subjective:    Patient ID: [name], female    DOB: [birth year], 69 y.o.   MRN: [id number]  HPI  She takes Zyrtec, uses Atrovent nasal spray and Veramyst nasal spray on a regular basis. She is also on PPI twice daily. Has come down with another respiratory infection. No fever or shaking chills. His around a grandchild and maybe catching respiratory infections that way. Says she had a rash on Avelox. Prior to that had taken Levaquin successfully. She is going to [place] on Thursday by airplane. Also continues to have issues with insomnia. Elderly father continues to be stressful situation for her.    Review of Systems     Objective:   Physical Exam Left TM is full. Right TM clear. Pharynx is clear. Neck is supple. Chest clear. She sounds nasally congested. Spoke with patient about issues with insomnia and management of recurrent respiratory infections       Assessment & Plan:  Left serous otitis media  Acute sinusitis  Insomnia due to situational stress  Plan: Depo-Medrol 80 mg IM. Zithromax Z-PAK take as directed with 1 refill. If not better in one week, have prescription refilled. Increase Ativan to 1 mg at bedtime when necessary sleep. Suggest Echinacea, vitamin C supplement 500 mg daily and multivitamin daily.  25 minutes spent with patient

## 2014-09-09 NOTE — Patient Instructions (Addendum)
Take Z pak as directed. Increase Ativan to one mg hs. Depomedrol given.

## 2014-10-03 ENCOUNTER — Other Ambulatory Visit: Payer: Federal, State, Local not specified - PPO | Admitting: Internal Medicine

## 2014-10-03 DIAGNOSIS — Z1321 Encounter for screening for nutritional disorder: Secondary | ICD-10-CM

## 2014-10-03 DIAGNOSIS — Z0000 Encounter for general adult medical examination without abnormal findings: Secondary | ICD-10-CM

## 2014-10-03 DIAGNOSIS — Z13220 Encounter for screening for lipoid disorders: Secondary | ICD-10-CM

## 2014-10-03 DIAGNOSIS — Z130 Encounter for screening for diseases of the blood and blood-forming organs and certain disorders involving the immune mechanism: Secondary | ICD-10-CM

## 2014-10-03 DIAGNOSIS — Z1329 Encounter for screening for other suspected endocrine disorder: Secondary | ICD-10-CM

## 2014-10-03 LAB — CBC WITH DIFFERENTIAL/PLATELET
BASOS PCT: 0 % (ref 0–1)
Basophils Absolute: 0 10*3/uL (ref 0.0–0.1)
EOS PCT: 2 % (ref 0–5)
Eosinophils Absolute: 0.1 10*3/uL (ref 0.0–0.7)
HCT: 42.1 % (ref 36.0–46.0)
Hemoglobin: 14.3 g/dL (ref 12.0–15.0)
LYMPHS PCT: 36 % (ref 12–46)
Lymphs Abs: 1.6 10*3/uL (ref 0.7–4.0)
MCH: 29.9 pg (ref 26.0–34.0)
MCHC: 34 g/dL (ref 30.0–36.0)
MCV: 87.9 fL (ref 78.0–100.0)
MPV: 9.9 fL (ref 8.6–12.4)
Monocytes Absolute: 0.3 10*3/uL (ref 0.1–1.0)
Monocytes Relative: 7 % (ref 3–12)
Neutro Abs: 2.5 10*3/uL (ref 1.7–7.7)
Neutrophils Relative %: 55 % (ref 43–77)
Platelets: 205 10*3/uL (ref 150–400)
RBC: 4.79 MIL/uL (ref 3.87–5.11)
RDW: 13.5 % (ref 11.5–15.5)
WBC: 4.5 10*3/uL (ref 4.0–10.5)

## 2014-10-03 LAB — COMPREHENSIVE METABOLIC PANEL
ALK PHOS: 89 U/L (ref 39–117)
ALT: 21 U/L (ref 0–35)
AST: 21 U/L (ref 0–37)
Albumin: 4.2 g/dL (ref 3.5–5.2)
BUN: 14 mg/dL (ref 6–23)
CALCIUM: 9.2 mg/dL (ref 8.4–10.5)
CO2: 30 meq/L (ref 19–32)
Chloride: 103 mEq/L (ref 96–112)
Creat: 0.64 mg/dL (ref 0.50–1.10)
Glucose, Bld: 80 mg/dL (ref 70–99)
Potassium: 4.2 mEq/L (ref 3.5–5.3)
SODIUM: 141 meq/L (ref 135–145)
Total Bilirubin: 0.6 mg/dL (ref 0.2–1.2)
Total Protein: 6.2 g/dL (ref 6.0–8.3)

## 2014-10-03 LAB — LIPID PANEL
CHOLESTEROL: 167 mg/dL (ref 0–200)
HDL: 70 mg/dL (ref >39)
LDL Cholesterol: 81 mg/dL (ref 0–99)
Total CHOL/HDL Ratio: 2.4 Ratio
Triglycerides: 81 mg/dL (ref <150)
VLDL: 16 mg/dL (ref 0–40)

## 2014-10-04 LAB — TSH: TSH: 3.561 u[IU]/mL (ref 0.350–4.500)

## 2014-10-04 LAB — VITAMIN D 25 HYDROXY (VIT D DEFICIENCY, FRACTURES): VIT D 25 HYDROXY: 43 ng/mL (ref 30–100)

## 2014-10-06 ENCOUNTER — Encounter: Payer: Self-pay | Admitting: Internal Medicine

## 2014-10-06 ENCOUNTER — Ambulatory Visit (INDEPENDENT_AMBULATORY_CARE_PROVIDER_SITE_OTHER): Payer: Federal, State, Local not specified - PPO | Admitting: Internal Medicine

## 2014-10-06 VITALS — BP 126/64 | HR 84 | Temp 98.0°F | Wt 155.0 lb

## 2014-10-06 DIAGNOSIS — M8580 Other specified disorders of bone density and structure, unspecified site: Secondary | ICD-10-CM

## 2014-10-06 DIAGNOSIS — Z853 Personal history of malignant neoplasm of breast: Secondary | ICD-10-CM

## 2014-10-06 DIAGNOSIS — G4700 Insomnia, unspecified: Secondary | ICD-10-CM

## 2014-10-06 DIAGNOSIS — E785 Hyperlipidemia, unspecified: Secondary | ICD-10-CM

## 2014-10-06 DIAGNOSIS — Z0000 Encounter for general adult medical examination without abnormal findings: Secondary | ICD-10-CM

## 2014-10-06 DIAGNOSIS — H6592 Unspecified nonsuppurative otitis media, left ear: Secondary | ICD-10-CM

## 2014-10-06 LAB — POCT URINALYSIS DIPSTICK
Bilirubin, UA: NEGATIVE
Glucose, UA: NEGATIVE
KETONES UA: NEGATIVE
Leukocytes, UA: NEGATIVE
Nitrite, UA: NEGATIVE
Protein, UA: NEGATIVE
RBC UA: NEGATIVE
Spec Grav, UA: 1.01
Urobilinogen, UA: NEGATIVE
pH, UA: 6

## 2014-10-06 NOTE — Progress Notes (Signed)
Subjective:    Patient ID: [name], female    DOB: 1945/09/19, 69 y.o.   MRN: [id number]  HPI  69 year old White Female in today for health maintenance exam and evaluation of medical issues. She currently is not on Medicare. She has a history of hyperlipidemia, history of breast cancer, osteopenia. History of recurrent left serous otitis media.  Had bilateral mastectomies in 1993 by Dr. Margot Chimes. Had chemotherapy for breast cancer. History of lumbar disc disease L5-S1 with annular disc bulging and osteophytosis.  Colonoscopy by Dr. Earlean Shawl 2014.  Patient had uterine polyp removed in 2010, fractured toe 2011, left frozen shoulder 1993.  She is intolerant of Macrodantin reportedly caused angioedema symptoms with lip swelling  Social history: Patient formerly worked as a Materials engineer. Husband is an Systems developer judge for the [employer]. She does not smoke. Social alcohol consumption. One daughter.  Family history: Father living with history of MI ,hypertension and stroke. He is in an assisted living facility and is 4 years old. Mother died at age 37 of a stroke.  Patient is to have cataract surgery in the near future. Has been referred by Dr. Delman Cheadle to Dr. Katy Fitch.   Was here in early January with sinusitis. Was treated with Zithromax Z-PAK and Depo-Medrol and did well.     Review of Systems  Constitutional: Negative.   HENT: Positive for ear pain.        Still has left ear discomfort  Eyes:       Cataracts  Respiratory: Negative.   Cardiovascular: Negative.   Gastrointestinal: Negative.   Endocrine: Negative.   Neurological: Negative.   Hematological: Negative.   Psychiatric/Behavioral: Negative.        Objective:   Physical Exam  Constitutional: She is oriented to person, place, and time. She appears well-developed and well-nourished. No distress.  HENT:  Head: Normocephalic and atraumatic.  Right Ear: External ear  normal.  Left Ear: External ear normal.  Mouth/Throat: Oropharynx is clear and moist. No oropharyngeal exudate.  Eyes: Conjunctivae are normal. Right eye exhibits no discharge. Left eye exhibits no discharge. No scleral icterus.  Left TM is full but not red  Neck: Neck supple. No JVD present. No thyromegaly present.  Cardiovascular: Normal rate, regular rhythm, normal heart sounds and intact distal pulses.   No murmur heard. Bilateral mastectomies. No axillary adenopathy  Pulmonary/Chest: Effort normal and breath sounds normal. No respiratory distress. She has no wheezes. She has no rales. She exhibits no tenderness.  Abdominal: Soft. Bowel sounds are normal. She exhibits no distension and no mass. There is no tenderness. There is no rebound and no guarding.  Genitourinary:  Deferred to GYN  Musculoskeletal: Normal range of motion. She exhibits no edema.  Lymphadenopathy:    She has no cervical adenopathy.  Neurological: She is alert and oriented to person, place, and time. She has normal reflexes. No cranial nerve deficit. Coordination normal.  Skin: Skin is warm and dry. No rash noted. She is not diaphoretic.  Psychiatric: She has a normal mood and affect. Her behavior is normal. Judgment and thought content normal.  Vitals reviewed.         Assessment & Plan:  Hyperlipidemia-treated with statin. Lipid panel within normal limits on generic Lipitor 10 mg daily  Family history of heart disease and stroke-recommend taking 81 mg of aspirin daily  Monitoring TSH. It has been in the 3 range but stable. Repeat in 6 months.  Left serous  otitis media-see recommendations below  Osteopenia-last bone density study 2014. Was stable. Can be repeated this year or next year. Recommend vitamin D supplement  Plan: Recommend DayQuil over-the-counter every morning to see if ear discomfort will disappear. If not, may need to see ENT physician as this appears to be chronic/recurrent. She will return  in 6 months for fasting lipid panel liver functions and TSH with office visit.

## 2014-10-06 NOTE — Patient Instructions (Signed)
Continue same medications and return in 6 months for office visit, TSH, lipid panel liver functions.

## 2014-12-05 ENCOUNTER — Ambulatory Visit (INDEPENDENT_AMBULATORY_CARE_PROVIDER_SITE_OTHER): Payer: Federal, State, Local not specified - PPO | Admitting: Internal Medicine

## 2014-12-05 ENCOUNTER — Encounter: Payer: Self-pay | Admitting: Internal Medicine

## 2014-12-05 VITALS — BP 110/64 | HR 78 | Temp 98.0°F

## 2014-12-05 DIAGNOSIS — R8290 Unspecified abnormal findings in urine: Secondary | ICD-10-CM | POA: Diagnosis not present

## 2014-12-05 DIAGNOSIS — R350 Frequency of micturition: Secondary | ICD-10-CM | POA: Diagnosis not present

## 2014-12-05 LAB — POCT URINALYSIS DIPSTICK
Bilirubin, UA: NEGATIVE
GLUCOSE UA: NEGATIVE
KETONES UA: NEGATIVE
NITRITE UA: NEGATIVE
PH UA: 5
PROTEIN UA: NEGATIVE
Spec Grav, UA: >=1.03
Urobilinogen, UA: NEGATIVE

## 2014-12-05 MED ORDER — SULFAMETHOXAZOLE-TRIMETHOPRIM 800-160 MG PO TABS: 1.0000 | ORAL_TABLET | Freq: Two times a day (BID) | ORAL | Status: DC

## 2014-12-05 NOTE — Patient Instructions (Signed)
Take Septra DS 1 by mouth twice a day for 5 days.

## 2014-12-05 NOTE — Progress Notes (Signed)
   Subjective:    Patient ID: [name], female    DOB: 07-01-1946, 69 y.o.   MRN: [id number]  HPI  Onset of UTI symptoms with burning at end of straining this morning. Has been out of town for several days. Did not feel well last night. Says she had a UTI about a year ago treated with Sulfa medication. This is not on file as an encounter in [place]. Cannot take Macrodantin or Avelox. No CVA tenderness. No fever or shaking chills.    Review of Systems     Objective:   Physical Exam urinalysis is abnormal with LE present. No CVA tenderness    Assessment & Plan:    Acute UTI  Plan: Septra  DS 1 by mouth twice a day for 5 days. May take Azo-Standard over-the-counter. Culture sent.

## 2014-12-08 LAB — URINE CULTURE: Colony Count: >=100000

## 2015-01-14 DIAGNOSIS — H269 Unspecified cataract: Secondary | ICD-10-CM

## 2015-01-14 DIAGNOSIS — H2510 Age-related nuclear cataract, unspecified eye: Secondary | ICD-10-CM | POA: Insufficient documentation

## 2015-01-14 HISTORY — DX: Unspecified cataract: H26.9

## 2015-01-22 ENCOUNTER — Ambulatory Visit (INDEPENDENT_AMBULATORY_CARE_PROVIDER_SITE_OTHER): Payer: Federal, State, Local not specified - PPO | Admitting: Internal Medicine

## 2015-01-22 ENCOUNTER — Encounter: Payer: Self-pay | Admitting: Internal Medicine

## 2015-01-22 VITALS — BP 110/66 | HR 64 | Temp 97.8°F | Ht 67.0 in | Wt 157.0 lb

## 2015-01-22 DIAGNOSIS — R1031 Right lower quadrant pain: Secondary | ICD-10-CM

## 2015-01-22 LAB — POCT URINALYSIS DIPSTICK
Bilirubin, UA: NEGATIVE
Glucose, UA: NEGATIVE
KETONES UA: NEGATIVE
Leukocytes, UA: NEGATIVE
Nitrite, UA: NEGATIVE
PH UA: 5
Protein, UA: NEGATIVE
RBC UA: NEGATIVE
UROBILINOGEN UA: NEGATIVE

## 2015-01-23 ENCOUNTER — Telehealth: Payer: Self-pay | Admitting: *Deleted

## 2015-01-23 LAB — BASIC METABOLIC PANEL
BUN: 14 mg/dL (ref 6–23)
CO2: 28 mEq/L (ref 19–32)
Calcium: 9.4 mg/dL (ref 8.4–10.5)
Chloride: 102 mEq/L (ref 96–112)
Creat: 0.7 mg/dL (ref 0.50–1.10)
Glucose, Bld: 112 mg/dL — ABNORMAL HIGH (ref 70–99)
Potassium: 3.7 mEq/L (ref 3.5–5.3)
Sodium: 139 mEq/L (ref 135–145)

## 2015-01-23 NOTE — Telephone Encounter (Signed)
Notified patient CT scan has been ordered she will call to schedule appt .

## 2015-01-24 ENCOUNTER — Other Ambulatory Visit: Payer: Self-pay | Admitting: Internal Medicine

## 2015-01-26 NOTE — Patient Instructions (Signed)
Urinalysis is normal. Have CT of abdomen and pelvis with contrast in the near future.

## 2015-01-26 NOTE — Progress Notes (Signed)
   Subjective:    Patient ID: [name], female    DOB: [birth year], 69 y.o.   MRN: [id number]  HPI  69 year old White Female is been having some vague right-sided abdominal pain for some time. There is no dysuria. No constipation. No diarrhea. Has felt a little bloated at times. Is worried about malignancy. Pain is not severe but has been persistent for several weeks. No blood in bowel movement. No black tarry stools. No urinary frequency.    Review of Systems     Objective:   Physical Exam  Abdominal exam: Bowel sounds are active abdomen is soft and nondistended. No hepatosplenomegaly or masses appreciated. Some vague right abdominal pain without rebound tenderness.      Assessment & Plan:  Right lower quadrant abdominal pain-nonacute  Anxiety-patient worried about possible malignancy  Plan: Urinalysis is normal. Patient will have CT of the abdomen and pelvis for further evaluation.

## 2015-01-30 ENCOUNTER — Other Ambulatory Visit: Payer: Federal, State, Local not specified - PPO

## 2015-02-02 ENCOUNTER — Ambulatory Visit
Admission: RE | Admit: 2015-02-02 | Discharge: 2015-02-02 | Disposition: A | Discharge status: Home / Self Care | Payer: Federal, State, Local not specified - PPO | Source: Ambulatory Visit | Attending: Internal Medicine | Admitting: Internal Medicine

## 2015-02-02 DIAGNOSIS — R1031 Right lower quadrant pain: Secondary | ICD-10-CM

## 2015-02-02 MED ORDER — IOPAMIDOL (ISOVUE-300) INJECTION 61%
100.0000 mL | Freq: Once | INTRAVENOUS | Status: AC | PRN
  Administered 2015-02-02: 100 mL via INTRAVENOUS

## 2015-02-03 ENCOUNTER — Telehealth: Payer: Self-pay | Admitting: *Deleted

## 2015-02-03 NOTE — Telephone Encounter (Signed)
Left message for patient to call back  

## 2015-02-04 NOTE — Telephone Encounter (Signed)
Reviewed test results with patient

## 2015-02-23 ENCOUNTER — Other Ambulatory Visit: Payer: Self-pay

## 2015-03-21 ENCOUNTER — Ambulatory Visit (INDEPENDENT_AMBULATORY_CARE_PROVIDER_SITE_OTHER): Payer: Federal, State, Local not specified - PPO | Admitting: Physician Assistant

## 2015-03-21 VITALS — BP 122/72 | HR 90 | Temp 99.7°F | Resp 17 | Ht 67.0 in | Wt 157.0 lb

## 2015-03-21 DIAGNOSIS — J069 Acute upper respiratory infection, unspecified: Secondary | ICD-10-CM

## 2015-03-21 MED ORDER — BENZONATATE 100 MG PO CAPS: 100.0000 mg | ORAL_CAPSULE | Freq: Three times a day (TID) | ORAL | Status: DC | PRN

## 2015-03-21 MED ORDER — IPRATROPIUM BROMIDE 0.03 % NA SOLN: 2.0000 | Freq: Two times a day (BID) | NASAL | Status: DC

## 2015-03-21 NOTE — Progress Notes (Signed)
   Subjective:    Patient ID: [name], female    DOB: 02/05/1946, 69 y.o.   MRN: [id number]  HPI Patient presents for lethargy that has been present for the past 4 days, but additionally for productive cough as well. Endorses rhinorrhea and fever of 100 degrees that resolved with tylenol. Denies HA, sinus pressure, wheezing, sore throat, SOB/CP, N/V. Has taken codeine cough medication and atrovent at night with some relief. No h/o asthma or allergies.  Sick contact includes 29 year old granddaughter. Former smoker that quit 40 years ago. Med allergies to Macrodantin and Avelox.    Review of Systems As noted above.    Objective:   Physical Exam  Constitutional: She is oriented to person, place, and time. She appears well-developed and well-nourished. No distress.  Blood pressure 122/72, pulse 90, temperature 99.7 F (37.6 C), temperature source Oral, resp. rate 17, height 5\' 7"  (1.702 m), weight 157 lb (71.215 kg), SpO2 98 %.   HENT:  Head: Normocephalic and atraumatic.  Right Ear: Tympanic membrane, external ear and ear canal normal.  Left Ear: Tympanic membrane, external ear and ear canal normal.  Nose: Rhinorrhea (with erythema) present. Right sinus exhibits no maxillary sinus tenderness and no frontal sinus tenderness. Left sinus exhibits no maxillary sinus tenderness and no frontal sinus tenderness.  Mouth/Throat: Uvula is midline and mucous membranes are normal. Posterior oropharyngeal erythema present. No oropharyngeal exudate or posterior oropharyngeal edema.  Eyes: Conjunctivae are normal. Pupils are equal, round, and reactive to light. Right eye exhibits no discharge. Left eye exhibits no discharge. No scleral icterus.  Neck: Normal range of motion. Neck supple. No thyromegaly present.  Cardiovascular: Normal rate, regular rhythm and normal heart sounds.  Exam reveals no gallop and no friction rub.   No murmur heard. Pulmonary/Chest: Effort normal and breath sounds normal. No  respiratory distress. She has no decreased breath sounds. She has no wheezes. She has no rhonchi. She has no rales.  Abdominal: Soft. Bowel sounds are normal. She exhibits no distension. There is no tenderness. There is no rebound and no guarding.  Lymphadenopathy:    She has cervical adenopathy.  Neurological: She is alert and oriented to person, place, and time.  Skin: Skin is warm and dry. No rash noted. She is not diaphoretic. No erythema. No pallor.      Assessment & Plan:  1. Acute upper respiratory infection Can continue codeine cough medication. Tylenol or ibuprofen for fevers. - benzonatate (TESSALON) 100 MG capsule; Take 1-2 capsules (100-200 mg total) by mouth 3 (three) times daily as needed for cough.  Dispense: 40 capsule; Refill: 0 - ipratropium (ATROVENT) 0.03 % nasal spray; Place 2 sprays into both nostrils 2 (two) times daily.  Dispense: 30 mL; Refill: 0   Cyril Railey PA-C  [facility] 03/21/2015 11:31 AM

## 2015-03-21 NOTE — Patient Instructions (Signed)
Upper Respiratory Infection, Adult An upper respiratory infection (URI) is also sometimes known as the common cold. The upper respiratory tract includes the nose, sinuses, throat, trachea, and bronchi. Bronchi are the airways leading to the lungs. Most people improve within 1 week, but symptoms can last up to 2 weeks. A residual cough may last even longer.  CAUSES Many different viruses can infect the tissues lining the upper respiratory tract. The tissues become irritated and inflamed and often become very moist. Mucus production is also common. A cold is contagious. You can easily spread the virus to others by oral contact. This includes kissing, sharing a glass, coughing, or sneezing. Touching your mouth or nose and then touching a surface, which is then touched by another person, can also spread the virus. SYMPTOMS  Symptoms typically develop 1 to 3 days after you come in contact with a cold virus. Symptoms vary from person to person. They may include:  Runny nose.  Sneezing.  Nasal congestion.  Sinus irritation.  Sore throat.  Loss of voice (laryngitis).  Cough.  Fatigue.  Muscle aches.  Loss of appetite.  Headache.  Low-grade fever. DIAGNOSIS  You might diagnose your own cold based on familiar symptoms, since most people get a cold 2 to 3 times a year. Your caregiver can confirm this based on your exam. Most importantly, your caregiver can check that your symptoms are not due to another disease such as strep throat, sinusitis, pneumonia, asthma, or epiglottitis. Blood tests, throat tests, and X-rays are not necessary to diagnose a common cold, but they may sometimes be helpful in excluding other more serious diseases. Your caregiver will decide if any further tests are required. RISKS AND COMPLICATIONS  You may be at risk for a more severe case of the common cold if you smoke cigarettes, have chronic heart disease (such as heart failure) or lung disease (such as asthma), or if  you have a weakened immune system. The very young and very old are also at risk for more serious infections. Bacterial sinusitis, middle ear infections, and bacterial pneumonia can complicate the common cold. The common cold can worsen asthma and chronic obstructive pulmonary disease (COPD). Sometimes, these complications can require emergency medical care and may be life-threatening. PREVENTION  The best way to protect against getting a cold is to practice good hygiene. Avoid oral or hand contact with people with cold symptoms. Wash your hands often if contact occurs. There is no clear evidence that vitamin C, vitamin E, echinacea, or exercise reduces the chance of developing a cold. However, it is always recommended to get plenty of rest and practice good nutrition. TREATMENT  Treatment is directed at relieving symptoms. There is no cure. Antibiotics are not effective, because the infection is caused by a virus, not by bacteria. Treatment may include:  Increased fluid intake. Sports drinks offer valuable electrolytes, sugars, and fluids.  Breathing heated mist or steam (vaporizer or shower).  Eating chicken soup or other clear broths, and maintaining good nutrition.  Getting plenty of rest.  Using gargles or lozenges for comfort.  Controlling fevers with ibuprofen or acetaminophen as directed by your caregiver.  Increasing usage of your inhaler if you have asthma. Zinc gel and zinc lozenges, taken in the first 24 hours of the common cold, can shorten the duration and lessen the severity of symptoms. Pain medicines may help with fever, muscle aches, and throat pain. A variety of non-prescription medicines are available to treat congestion and runny nose. Your caregiver   can make recommendations and may suggest nasal or lung inhalers for other symptoms.  HOME CARE INSTRUCTIONS   Only take over-the-counter or prescription medicines for pain, discomfort, or fever as directed by your  caregiver.  Use a warm mist humidifier or inhale steam from a shower to increase air moisture. This may keep secretions moist and make it easier to breathe.  Drink enough water and fluids to keep your urine clear or pale yellow.  Rest as needed.  Return to work when your temperature has returned to normal or as your caregiver advises. You may need to stay home longer to avoid infecting others. You can also use a face mask and careful hand washing to prevent spread of the virus. SEEK MEDICAL CARE IF:   After the first few days, you feel you are getting worse rather than better.  You need your caregiver's advice about medicines to control symptoms.  You develop chills, worsening shortness of breath, or brown or red sputum. These may be signs of pneumonia.  You develop yellow or brown nasal discharge or pain in the face, especially when you bend forward. These may be signs of sinusitis.  You develop a fever, swollen neck glands, pain with swallowing, or white areas in the back of your throat. These may be signs of strep throat. SEEK IMMEDIATE MEDICAL CARE IF:   You have a fever.  You develop severe or persistent headache, ear pain, sinus pain, or chest pain.  You develop wheezing, a prolonged cough, cough up blood, or have a change in your usual mucus (if you have chronic lung disease).  You develop sore muscles or a stiff neck. Document Released: 02/08/2001 Document Revised: 11/07/2011 Document Reviewed: 11/20/2013 [employer] Patient Information 2015 [employer]. This information is not intended to replace advice given to you by your health care provider. Make sure you discuss any questions you have with your health care provider.  

## 2015-03-24 ENCOUNTER — Encounter: Payer: Self-pay | Admitting: Cardiology

## 2015-03-27 ENCOUNTER — Encounter: Payer: Self-pay | Admitting: Internal Medicine

## 2015-03-27 ENCOUNTER — Ambulatory Visit (INDEPENDENT_AMBULATORY_CARE_PROVIDER_SITE_OTHER): Payer: Federal, State, Local not specified - PPO | Admitting: Internal Medicine

## 2015-03-27 VITALS — BP 110/68 | HR 83 | Temp 98.9°F | Wt 157.5 lb

## 2015-03-27 DIAGNOSIS — R8290 Unspecified abnormal findings in urine: Secondary | ICD-10-CM | POA: Diagnosis not present

## 2015-03-27 DIAGNOSIS — R300 Dysuria: Secondary | ICD-10-CM | POA: Diagnosis not present

## 2015-03-27 DIAGNOSIS — N3090 Cystitis, unspecified without hematuria: Secondary | ICD-10-CM

## 2015-03-27 DIAGNOSIS — J0100 Acute maxillary sinusitis, unspecified: Secondary | ICD-10-CM | POA: Diagnosis not present

## 2015-03-27 LAB — POCT URINALYSIS DIPSTICK
Bilirubin, UA: NEGATIVE
Blood, UA: NEGATIVE
GLUCOSE UA: NEGATIVE
Ketones, UA: NEGATIVE
Nitrite, UA: NEGATIVE
Protein, UA: NEGATIVE
Spec Grav, UA: <=1.005
Urobilinogen, UA: NEGATIVE
pH, UA: 6

## 2015-03-27 MED ORDER — HYDROCODONE-HOMATROPINE 5-1.5 MG/5ML PO SYRP
5.0000 mL | ORAL_SOLUTION | Freq: Three times a day (TID) | ORAL | Status: DC | PRN
Start: 2015-03-27 — End: 2015-04-07

## 2015-03-27 MED ORDER — METHYLPREDNISOLONE ACETATE 80 MG/ML IJ SUSP
80.0000 mg | Freq: Once | INTRAMUSCULAR | Status: AC
  Administered 2015-03-27: 80 mg via INTRAMUSCULAR

## 2015-03-27 NOTE — Progress Notes (Signed)
   Subjective:    Patient ID: [name], female    DOB: Sep 28, 1945, 69 y.o.   MRN: [id number]  HPI  2 week history of URI symptoms. She's tried her usual allergy medications without relief. [place]. Doesn't seem to be getting any better. Has nasal congestion and sounds congested when she speaks. No sore throat. No fever or shaking chills. Congested cough for which she's been taking Hycodan at bedtime. Needs refill on that.  Onset yesterday of UTI symptoms with frequency and dysuria. No back pain. No fever chills nausea or vomiting with that. Had urinary tract infection in April treated with Septra. Organism was Citrobacter koseri sensitive to Septra.    Review of Systems     Objective:   Physical Exam Urine dipstick is abnormal. Culture sent.  TMs are clear. Pharynx is clear. Neck is supple without thyromegaly or adenopathy. Chest clear to auscultation. She has a deep congested cough.       Assessment & Plan:  Acute sinusitis  Acute cystitis  Plan: There was some confusion at the pharmacy. Apparently there was a prophylactic prescription for Septra DS daily for 30 tablets that patient had filled earlier this month but has not taken. She is to take Septra DS 1 by mouth twice a day for 10 days. Refill Hycodan. Urine culture is pending. I was trying to find an antibiotic that would treat both respiratory and urinary symptoms nothing Septra isn't acceptable one to try. Given Depo-Medrol 80 mg IM in office today for congestion.  25 minutes spent with patient assessing 2 separate problems plus calls to pharmacy for clarification.

## 2015-03-27 NOTE — Patient Instructions (Signed)
Culture urine. Take Septra DS bid x 10 days. Depomedrol 80 mg IM

## 2015-03-30 LAB — URINE CULTURE: Colony Count: >=100000

## 2015-04-02 ENCOUNTER — Telehealth: Payer: Self-pay | Admitting: *Deleted

## 2015-04-02 NOTE — Telephone Encounter (Signed)
Left message for patient to call back  

## 2015-04-02 NOTE — Telephone Encounter (Signed)
Patient returned call reviewed urine culture results instructed patient to continue Septra as ordered.

## 2015-04-06 ENCOUNTER — Other Ambulatory Visit: Payer: Federal, State, Local not specified - PPO | Admitting: Internal Medicine

## 2015-04-06 DIAGNOSIS — Z1329 Encounter for screening for other suspected endocrine disorder: Secondary | ICD-10-CM

## 2015-04-06 DIAGNOSIS — E785 Hyperlipidemia, unspecified: Secondary | ICD-10-CM

## 2015-04-06 LAB — TSH: TSH: 3.373 u[IU]/mL (ref 0.350–4.500)

## 2015-04-06 LAB — LIPID PANEL
Cholesterol: 135 mg/dL (ref 125–200)
HDL: 53 mg/dL (ref >=46)
LDL Cholesterol: 65 mg/dL (ref <130)
TRIGLYCERIDES: 84 mg/dL (ref <150)
Total CHOL/HDL Ratio: 2.5 Ratio (ref <=5.0)
VLDL: 17 mg/dL (ref <30)

## 2015-04-07 ENCOUNTER — Ambulatory Visit (INDEPENDENT_AMBULATORY_CARE_PROVIDER_SITE_OTHER): Payer: Federal, State, Local not specified - PPO | Admitting: Internal Medicine

## 2015-04-07 ENCOUNTER — Encounter: Payer: Self-pay | Admitting: Internal Medicine

## 2015-04-07 VITALS — BP 108/64 | HR 83 | Temp 97.9°F | Wt 157.0 lb

## 2015-04-07 DIAGNOSIS — K219 Gastro-esophageal reflux disease without esophagitis: Secondary | ICD-10-CM

## 2015-04-07 DIAGNOSIS — E785 Hyperlipidemia, unspecified: Secondary | ICD-10-CM

## 2015-04-07 NOTE — Progress Notes (Signed)
   Subjective:    Patient ID: Brandon Melnick, female    DOB: 1946-03-21, 69 y.o.   MRN: [id number]  HPI In today for six-month recheck. At her request, reviewed recent CT of the abdomen and pelvis with her. She is worried about possibility of fatty liver. Told her that this was not an unusual finding and usually responded to diet and exercise. There were several 79mm undetermined lesions in the liver. This can be followed. Lower abdomen/pelvis was normal. She is seeing an ENT physician, Dr. Benjamine Mola, and regarding chronic hoarseness. Is being treated with ranitidine for GE reflux.  UTI symptoms have improved on Septra. See culture results. For his was sensitive to Septra.  Has been allergy tested by Dr. Donneta Romberg with no significant environmental allergies  Review of Systems     Objective:   Physical Exam  TMs are clear. Pharynx is clear. Neck is supple. Chest clear. Cardiac exam regular rate and rhythm. Extremities without edema.      Assessment & Plan:  Anxiety  Insomnia  Possible fatty liver  Hyperlipidemia-lipid panel stable on lipid-lowering medication  Chronic hoarseness-to see ENT physician  GE reflux  Plan: Continue same medications and return for physical exam in 6 months.

## 2015-04-07 NOTE — Patient Instructions (Signed)
It was a pleasure to see you today.  Continue same medications and return in 6 months for physical exam. 

## 2015-04-20 ENCOUNTER — Other Ambulatory Visit: Payer: Self-pay | Admitting: Internal Medicine

## 2015-04-20 NOTE — Telephone Encounter (Signed)
Refill x 3 months 

## 2015-05-08 ENCOUNTER — Ambulatory Visit (INDEPENDENT_AMBULATORY_CARE_PROVIDER_SITE_OTHER): Payer: Federal, State, Local not specified - PPO | Admitting: Internal Medicine

## 2015-05-08 VITALS — Temp 98.1°F

## 2015-05-08 DIAGNOSIS — Z23 Encounter for immunization: Secondary | ICD-10-CM

## 2015-05-08 NOTE — Progress Notes (Signed)
Patient given VIS information.

## 2015-05-08 NOTE — Progress Notes (Signed)
Patient presents today for Prevnar vaccine. Patient tolerated injection well.

## 2015-08-07 ENCOUNTER — Other Ambulatory Visit: Payer: Self-pay | Admitting: Internal Medicine

## 2015-10-08 ENCOUNTER — Other Ambulatory Visit: Payer: Federal, State, Local not specified - PPO | Admitting: Internal Medicine

## 2015-10-08 DIAGNOSIS — Z130 Encounter for screening for diseases of the blood and blood-forming organs and certain disorders involving the immune mechanism: Secondary | ICD-10-CM

## 2015-10-08 DIAGNOSIS — Z1329 Encounter for screening for other suspected endocrine disorder: Secondary | ICD-10-CM

## 2015-10-08 DIAGNOSIS — E785 Hyperlipidemia, unspecified: Secondary | ICD-10-CM

## 2015-10-08 DIAGNOSIS — M8580 Other specified disorders of bone density and structure, unspecified site: Secondary | ICD-10-CM

## 2015-10-08 DIAGNOSIS — Z0000 Encounter for general adult medical examination without abnormal findings: Secondary | ICD-10-CM

## 2015-10-08 LAB — COMPLETE METABOLIC PANEL WITH GFR
ALT: 20 U/L (ref 6–29)
AST: 24 U/L (ref 10–35)
Albumin: 4.2 g/dL (ref 3.6–5.1)
Alkaline Phosphatase: 79 U/L (ref 33–130)
BUN: 10 mg/dL (ref 7–25)
CALCIUM: 9.2 mg/dL (ref 8.6–10.4)
CHLORIDE: 105 mmol/L (ref 98–110)
CO2: 28 mmol/L (ref 20–31)
CREATININE: 0.72 mg/dL (ref 0.50–0.99)
GFR, EST NON AFRICAN AMERICAN: 87 mL/min (ref >=60)
Glucose, Bld: 81 mg/dL (ref 65–99)
POTASSIUM: 3.9 mmol/L (ref 3.5–5.3)
Sodium: 141 mmol/L (ref 135–146)
Total Bilirubin: 0.7 mg/dL (ref 0.2–1.2)
Total Protein: 6.2 g/dL (ref 6.1–8.1)

## 2015-10-08 LAB — CBC WITH DIFFERENTIAL/PLATELET
Basophils Absolute: 0 10*3/uL (ref 0.0–0.1)
Basophils Relative: 0 % (ref 0–1)
EOS ABS: 0.1 10*3/uL (ref 0.0–0.7)
EOS PCT: 2 % (ref 0–5)
HCT: 42.1 % (ref 36.0–46.0)
Hemoglobin: 13.9 g/dL (ref 12.0–15.0)
LYMPHS ABS: 1.4 10*3/uL (ref 0.7–4.0)
Lymphocytes Relative: 29 % (ref 12–46)
MCH: 29.7 pg (ref 26.0–34.0)
MCHC: 33 g/dL (ref 30.0–36.0)
MCV: 90 fL (ref 78.0–100.0)
MONO ABS: 0.3 10*3/uL (ref 0.1–1.0)
MPV: 9.7 fL (ref 8.6–12.4)
Monocytes Relative: 6 % (ref 3–12)
Neutro Abs: 3 10*3/uL (ref 1.7–7.7)
Neutrophils Relative %: 63 % (ref 43–77)
PLATELETS: 189 10*3/uL (ref 150–400)
RBC: 4.68 MIL/uL (ref 3.87–5.11)
RDW: 13.3 % (ref 11.5–15.5)
WBC: 4.8 10*3/uL (ref 4.0–10.5)

## 2015-10-08 LAB — LIPID PANEL
CHOL/HDL RATIO: 2.3 ratio (ref <=5.0)
CHOLESTEROL: 155 mg/dL (ref 125–200)
HDL: 66 mg/dL (ref >=46)
LDL Cholesterol: 68 mg/dL (ref <130)
Triglycerides: 104 mg/dL (ref <150)
VLDL: 21 mg/dL (ref <30)

## 2015-10-08 LAB — TSH: TSH: 2.66 mIU/L

## 2015-10-09 LAB — VITAMIN D 25 HYDROXY (VIT D DEFICIENCY, FRACTURES): Vit D, 25-Hydroxy: 37 ng/mL (ref 30–100)

## 2015-10-13 ENCOUNTER — Encounter: Payer: Federal, State, Local not specified - PPO | Admitting: Internal Medicine

## 2015-10-27 ENCOUNTER — Ambulatory Visit (INDEPENDENT_AMBULATORY_CARE_PROVIDER_SITE_OTHER): Payer: Federal, State, Local not specified - PPO | Admitting: Internal Medicine

## 2015-10-27 ENCOUNTER — Encounter: Payer: Self-pay | Admitting: Internal Medicine

## 2015-10-27 VITALS — BP 116/78 | HR 73 | Temp 97.4°F | Resp 20 | Wt 163.0 lb

## 2015-10-27 DIAGNOSIS — R300 Dysuria: Secondary | ICD-10-CM | POA: Diagnosis not present

## 2015-10-27 DIAGNOSIS — R8290 Unspecified abnormal findings in urine: Secondary | ICD-10-CM

## 2015-10-27 DIAGNOSIS — N390 Urinary tract infection, site not specified: Secondary | ICD-10-CM

## 2015-10-27 LAB — POCT URINALYSIS DIPSTICK
BILIRUBIN UA: NEGATIVE
Glucose, UA: NEGATIVE
KETONES UA: NEGATIVE
Nitrite, UA: NEGATIVE
Protein, UA: NEGATIVE
SPEC GRAV UA: 1.01
Urobilinogen, UA: 0.2
pH, UA: 6.5

## 2015-10-27 MED ORDER — CEPHALEXIN 500 MG PO CAPS: 500.0000 mg | ORAL_CAPSULE | Freq: Four times a day (QID) | ORAL | Status: DC

## 2015-10-27 NOTE — Progress Notes (Signed)
   Subjective:    Patient ID: [name], female    DOB: 08-Jul-1946, 70 y.o.   MRN: [id number]  HPI Patient in today with UTI symptoms onset last week. She has prophylactic Bactrim DS to take after intercourse but sometimes doesn't do that. Last UTI was April 2016 and culture grew Citrobacter koseri. She was treated with Bactrim. Patient denies chills fever significant back pain. Main complaint is urinary frequency and dysuria.    Review of Systems     Objective:   Physical Exam  No CVA tenderness. Urine dipstick is abnormal. Culture sent.      Assessment & Plan:  Acute UTI  Plan: Keflex 500 mg 4 times daily for 7 days. She is intolerant of Avelox it causes a rash so we cannot use Cipro. Culture is pending. May take over-the-counter Azo-Standard for dysuria.

## 2015-10-29 ENCOUNTER — Telehealth: Payer: Self-pay

## 2015-10-29 LAB — CULTURE, URINE COMPREHENSIVE

## 2015-10-29 MED ORDER — SULFAMETHOXAZOLE-TRIMETHOPRIM 800-160 MG PO TABS: 1.0000 | ORAL_TABLET | Freq: Two times a day (BID) | ORAL | Status: DC

## 2015-10-29 NOTE — Telephone Encounter (Signed)
-----   Message from Elby Showers, MD sent at 10/29/2015 11:57 AM EST ----- She is allergic to Quinolone drugs like Cipro. Have her take the Bactrim DS she has on hand twice daily x 10 days. Keflex not sensitive to this organism so stop Keflex. Repaet U/A with nurse visit only in 2 weeks.

## 2015-10-29 NOTE — Telephone Encounter (Signed)
Patient notified and prescription sent to pharmacy

## 2015-11-02 ENCOUNTER — Ambulatory Visit (INDEPENDENT_AMBULATORY_CARE_PROVIDER_SITE_OTHER): Payer: Federal, State, Local not specified - PPO | Admitting: Internal Medicine

## 2015-11-02 ENCOUNTER — Encounter: Payer: Self-pay | Admitting: Internal Medicine

## 2015-11-02 VITALS — BP 128/78 | HR 88 | Temp 98.0°F | Resp 16 | Ht 67.0 in | Wt 163.0 lb

## 2015-11-02 DIAGNOSIS — Z882 Allergy status to sulfonamides status: Secondary | ICD-10-CM | POA: Diagnosis not present

## 2015-11-02 DIAGNOSIS — N390 Urinary tract infection, site not specified: Secondary | ICD-10-CM

## 2015-11-02 LAB — POCT URINALYSIS DIPSTICK
Bilirubin, UA: NEGATIVE
Blood, UA: NEGATIVE
GLUCOSE UA: NEGATIVE
KETONES UA: NEGATIVE
Leukocytes, UA: NEGATIVE
Nitrite, UA: NEGATIVE
Protein, UA: NEGATIVE
SPEC GRAV UA: 1.015
Urobilinogen, UA: 0.2
pH, UA: 6.5

## 2015-11-02 NOTE — Patient Instructions (Signed)
Do not take sulfa drugs. Urinary tract infection has resolved. Urinate after intercourse.

## 2015-11-02 NOTE — Progress Notes (Signed)
   Subjective:    Patient ID: [name], female    DOB: 06-21-1946, 70 y.o.   MRN: [id number]  HPI  Patient was changed from Keflex to Trimethoprim Sulfa recently for a Citrobacter urinary tract infection. Sensitivity was not reported to Keflex.  Organism was sensitive to Trimethoprim Sulfa. She called yesterday saying that her lower lip was swollen and tingling. I told her to stop the sulfa drug. She took Benadryl. Symptoms improved some. Her lower lip is still swollen. She says that she feels some irritation inside her lower lip but has not seen any lesions.    Review of Systems     Objective:   Physical Exam  Lower lip appears to be swollen and has a red area left lower lip without blister. I wondered if she could be developing herpes simplex lesion. She doesn't have a prior history of herpes simplex of the lip. Inside lower lip I do not see any ulcerations. Dipstick urinalysis today is normal. Has no generalized rash on face or trunk.      Assessment & Plan:  Urinary tract infection-resolved  Possible Sulfa allergy  Plan: To be safe I have advised her not to take anymore trimethoprim sulfa. She was using it prophylactically after intercourse. Fortunately she's only had a couple of urinary tract infections in the past year. Advised to urinate after sexual intercourse. For now, we will label her allergic to Sulfa

## 2015-11-10 ENCOUNTER — Ambulatory Visit (INDEPENDENT_AMBULATORY_CARE_PROVIDER_SITE_OTHER): Payer: Federal, State, Local not specified - PPO | Admitting: Internal Medicine

## 2015-11-10 ENCOUNTER — Encounter: Payer: Self-pay | Admitting: Internal Medicine

## 2015-11-10 VITALS — BP 112/68 | HR 72 | Temp 97.8°F | Ht 67.0 in | Wt 160.5 lb

## 2015-11-10 DIAGNOSIS — Z853 Personal history of malignant neoplasm of breast: Secondary | ICD-10-CM | POA: Diagnosis not present

## 2015-11-10 DIAGNOSIS — Z0000 Encounter for general adult medical examination without abnormal findings: Secondary | ICD-10-CM

## 2015-11-10 DIAGNOSIS — G4700 Insomnia, unspecified: Secondary | ICD-10-CM | POA: Diagnosis not present

## 2015-11-10 DIAGNOSIS — E785 Hyperlipidemia, unspecified: Secondary | ICD-10-CM

## 2015-11-10 DIAGNOSIS — M8580 Other specified disorders of bone density and structure, unspecified site: Secondary | ICD-10-CM | POA: Diagnosis not present

## 2015-11-10 LAB — POCT URINALYSIS DIPSTICK
BILIRUBIN UA: NEGATIVE
Blood, UA: NEGATIVE
GLUCOSE UA: NEGATIVE
Ketones, UA: NEGATIVE
LEUKOCYTES UA: NEGATIVE
NITRITE UA: NEGATIVE
Protein, UA: NEGATIVE
Spec Grav, UA: <=1.005
UROBILINOGEN UA: 0.2
pH, UA: 6

## 2015-11-10 MED ORDER — LORAZEPAM 1 MG PO TABS: 1.0000 mg | ORAL_TABLET | Freq: Every day | ORAL | Status: DC

## 2015-11-26 NOTE — Progress Notes (Signed)
Subjective:    Patient ID: [name], female    DOB: May 22, 1946, 70 y.o.   MRN: [id number]  HPI 70 year old White Female in today for health maintenance exam and evaluation of medical issues. She has a history of hyperlipidemia, history of breast cancer, osteopenia and insomnia. Has had issues with left serous otitis media on several occasions.  She is not on Medicare. Recently had urinary tract infection in late February. Was initially treated with Keflex but culture grew Citrobacter which was not sensitive to Keflex. She was subsequently treated with trimethoprim sulfa but developed a swollen lower lip and tingling sensation. We decided it was an allergic reaction to Sulfa and labeled her allergic to Sulfa. She used to take trimethoprim sulfa prophylactically after intercourse and had not had recent urinary tract infections up until late February. However now she will not be able to take trimethoprim sulfa prophylactically.  Had bilateral mastectomies 1993 by Dr. Margot Chimes. Had chemotherapy for breast cancer. History of lumbar disc disease L4-S1 with annular disc bulging and osteophytosis.  Colonoscopy by Dr. Earlean Shawl in 2014  Patient had uterine polyp removed in 2010, fractured toe 2011, left frozen shoulder 1993  She is intolerant of Macrodantin as it reportedly causes angioedema symptoms with lip swelling. Cannot take Avelox either. This limits antibiotics that we can use to treat her with for urinary infections.  Social history: She formerly worked as a Copywriter, advertising and a Education officer, museum. Husband is an Systems developer judge for the [employer]. She does not smoke. Social alcohol consumption. One daughter.  Family history: Father living with history of MI, hypertension and stroke. He is in an assisted living facility and is 54 years old. Mother died at age 68 of a stroke.  Patient is status post cataract surgery by Dr. Katy Fitch. Sees Dr. Delman Cheadle for routine eye exam. Was  referred to Dr. Katy Fitch by Dr. Delman Cheadle.  Had sinusitis January 2016 treated with Zithromax Z-PAK and Depo-Medrol.    Review of Systems noncontributory     Objective:   Physical Exam  Constitutional: She is oriented to person, place, and time. She appears well-developed and well-nourished. No distress.  HENT:  Head: Normocephalic and atraumatic.  Right Ear: External ear normal.  Left Ear: External ear normal.  Nose: Nose normal.  Mouth/Throat: Oropharynx is clear and moist.  Eyes: Right eye exhibits no discharge. Left eye exhibits no discharge. No scleral icterus.  Neck: Neck supple. No JVD present. No thyromegaly present.  Cardiovascular: Normal rate, regular rhythm, normal heart sounds and intact distal pulses.   No murmur heard. Pulmonary/Chest: Effort normal and breath sounds normal. No respiratory distress. She has no wheezes. She has no rales. She exhibits no tenderness.  Breasts normal female without masses  Abdominal: Bowel sounds are normal. She exhibits no distension and no mass. There is no tenderness. There is no rebound and no guarding.  Genitourinary:  Deferred to GYN  Musculoskeletal: She exhibits no edema.  Lymphadenopathy:    She has no cervical adenopathy.  Neurological: She is alert and oriented to person, place, and time. She has normal reflexes. No cranial nerve deficit. Coordination normal.  Skin: Skin is warm and dry. No rash noted. She is not diaphoretic.  Psychiatric: She has a normal mood and affect. Her behavior is normal. Judgment and thought content normal.  Vitals reviewed.         Assessment & Plan:  Hyperlipidemia-treated with statin medication. Lipid panel within normal limits on generic Lipitor 10  mg daily  Family history of heart disease and stroke  Sulfa allergy  History of recent urinary tract infections  Left serous otitis media-no bouts recently  Osteopenia-recommend vitamin D supplement  History of breast cancer status post  bilateral mastectomies  History of insomnia  Plan: Return in one year or as needed

## 2015-11-26 NOTE — Patient Instructions (Signed)
It was a pleasure to see today. Return in one year or as needed. Continue same medications.

## 2016-02-06 ENCOUNTER — Other Ambulatory Visit: Payer: Self-pay | Admitting: Internal Medicine

## 2016-02-22 ENCOUNTER — Encounter: Payer: Self-pay | Admitting: Internal Medicine

## 2016-02-22 ENCOUNTER — Ambulatory Visit (INDEPENDENT_AMBULATORY_CARE_PROVIDER_SITE_OTHER): Payer: Federal, State, Local not specified - PPO | Admitting: Internal Medicine

## 2016-02-22 VITALS — BP 106/66 | HR 66 | Temp 97.8°F | Resp 18 | Wt 158.0 lb

## 2016-02-22 DIAGNOSIS — H6502 Acute serous otitis media, left ear: Secondary | ICD-10-CM | POA: Diagnosis not present

## 2016-02-22 NOTE — Progress Notes (Signed)
   Subjective:    Patient ID: [name], female    DOB: 09/25/1945, 70 y.o.   MRN: [id number]  HPI 70 year old female with recurrent bouts of serous otitis media in today saying she had fairly severe left ear ache on Friday, June 23. Seems better today. Did take some over-the-counter decongestants. She's having cataract surgery on left eye tomorrow. We did discuss possibility of giving injection of Depo-Medrol but I would like to hold off since she's having surgery tomorrow. She says she feels better than she did on June 23. Has not been able to clear her ear. Has felt a little bit of dizziness however the pain is much better.    Review of Systems see above     Objective:   Physical Exam Left TM is slightly full but not red. Right TM clear. Pharynx is clear.       Assessment & Plan:  Acute serous otitis media  Plan: Patient may continue with over-the-counter decongestants as needed. To have cataract surgery tomorrow. She was advised to check with ophthalmologist to make sure it's okay to take over-the-counter decongestants. Return when necessary.

## 2016-02-22 NOTE — Patient Instructions (Signed)
Take DayQuil when necessary left ear pain.

## 2016-04-29 ENCOUNTER — Ambulatory Visit (INDEPENDENT_AMBULATORY_CARE_PROVIDER_SITE_OTHER): Payer: Federal, State, Local not specified - PPO | Admitting: Internal Medicine

## 2016-04-29 ENCOUNTER — Encounter: Payer: Self-pay | Admitting: Internal Medicine

## 2016-04-29 VITALS — BP 108/70 | HR 68 | Temp 97.9°F | Ht 67.0 in | Wt 159.5 lb

## 2016-04-29 DIAGNOSIS — H8111 Benign paroxysmal vertigo, right ear: Secondary | ICD-10-CM

## 2016-04-29 DIAGNOSIS — Z7189 Other specified counseling: Secondary | ICD-10-CM | POA: Diagnosis not present

## 2016-04-29 DIAGNOSIS — Z7184 Encounter for health counseling related to travel: Secondary | ICD-10-CM

## 2016-04-29 MED ORDER — AZITHROMYCIN 250 MG PO TABS: ORAL_TABLET | ORAL | 0 refills | Status: DC

## 2016-04-29 MED ORDER — CEPHALEXIN 250 MG PO CAPS: 250.0000 mg | ORAL_CAPSULE | Freq: Four times a day (QID) | ORAL | 0 refills | Status: DC

## 2016-04-29 MED ORDER — MECLIZINE HCL 25 MG PO TABS: 25.0000 mg | ORAL_TABLET | Freq: Three times a day (TID) | ORAL | 0 refills | Status: DC | PRN

## 2016-04-29 NOTE — Patient Instructions (Signed)
Take Antivert 25 mg up to 3 times daily as needed for dizziness. Dizziness may persist 7-10 days. Be careful with position change. Zithromax Z-PAK and Keflex prescribed for travel.

## 2016-04-29 NOTE — Progress Notes (Signed)
   Subjective:    Patient ID: [name], female    DOB: Mar 22, 1946, 70 y.o.   MRN: [id number]  HPI Patient leaving for [place] next week. Has been experiencing some dizziness and fullness in left ear.    She had cataract extraction about 3 weeks ago and had a stye develop on her. Has felt some tearfulness since that time. Is concerned because she is leaving town. No fever or chills.  Discussed travel advice. We will prescribe Zithromax Z-PAK for travel. She also like to have something for potential urinary tract infection. Have prescribed seven-day course of Keflex for that.  No headache. No visual disturbance.  Review of Systems     Objective:   Physical Exam Brief neurological exam shows rightward nystagmus. Otherwise she is neurologically intact. Left TM is clear. Right TM is clear. Pharynx is clear. Neck supple. Chest clear.        Assessment & Plan:   Benign positional vertigo  Travel advice encounter  Plan: Zithromax Z-Pak take 2 tablets day one followed by 1 tablet days 2 through 5 should she develop an upper respiratory infection. Antivert 25 mg up to 3 times daily as needed for dizziness. To have on hand for potential UTI Keflex 250 mg 4 times daily for 7 days.

## 2016-08-01 IMAGING — CR DG WRIST COMPLETE 3+V*L*
4 series · 4 of 4 positions shown · non-contrast
Comparison: None.

CLINICAL DATA: Fall, ulnar styloid pain.

EXAM:
LEFT WRIST - COMPLETE 3+ VIEW

[view not recorded (1 of 4)]
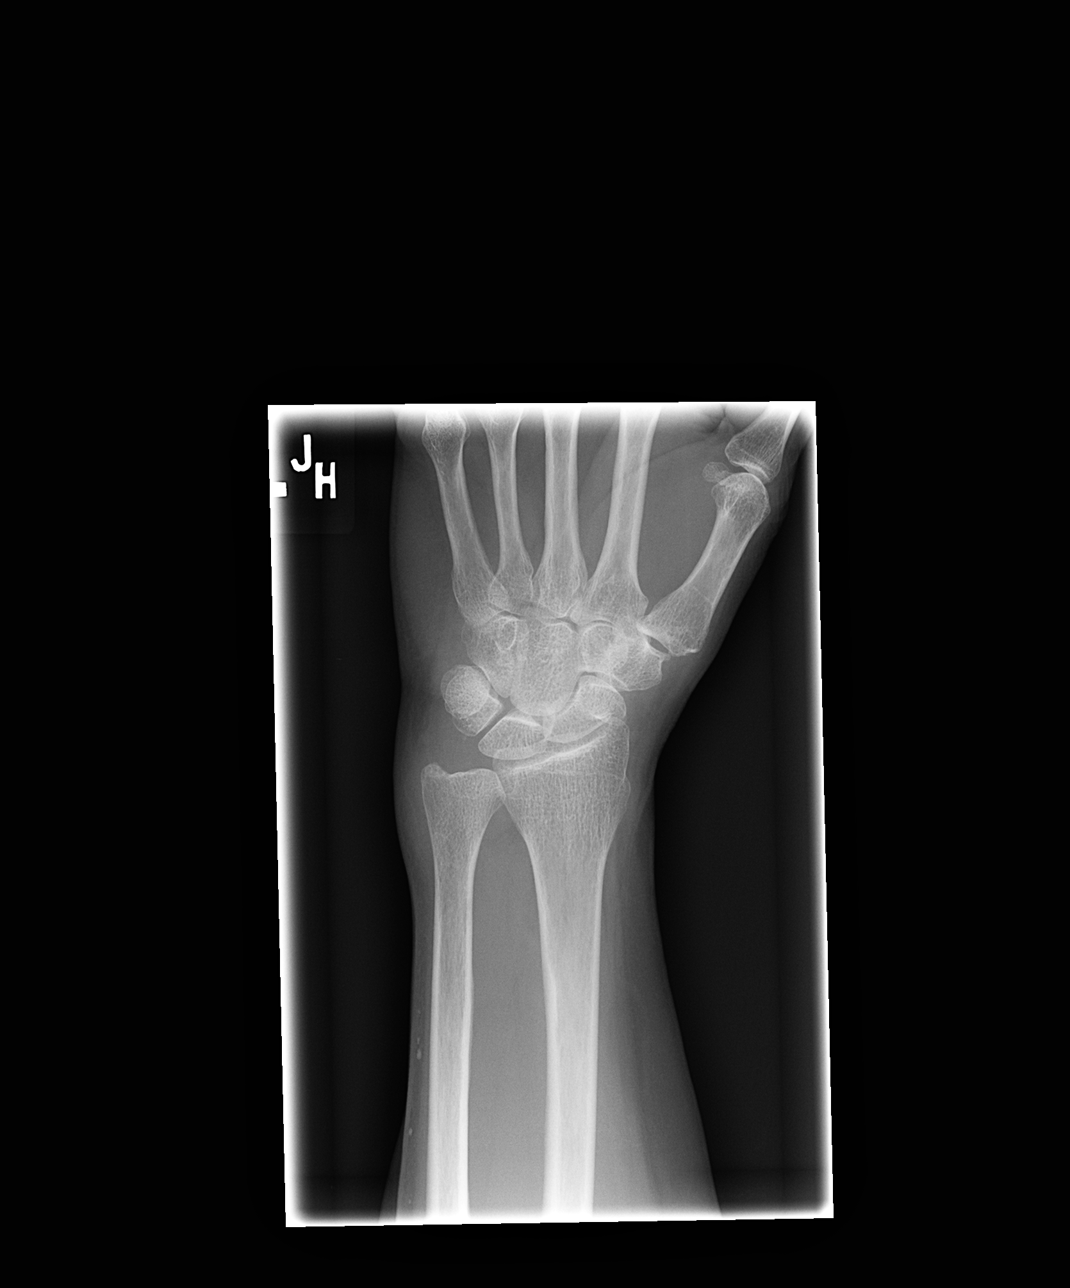

[view not recorded (2 of 4)]
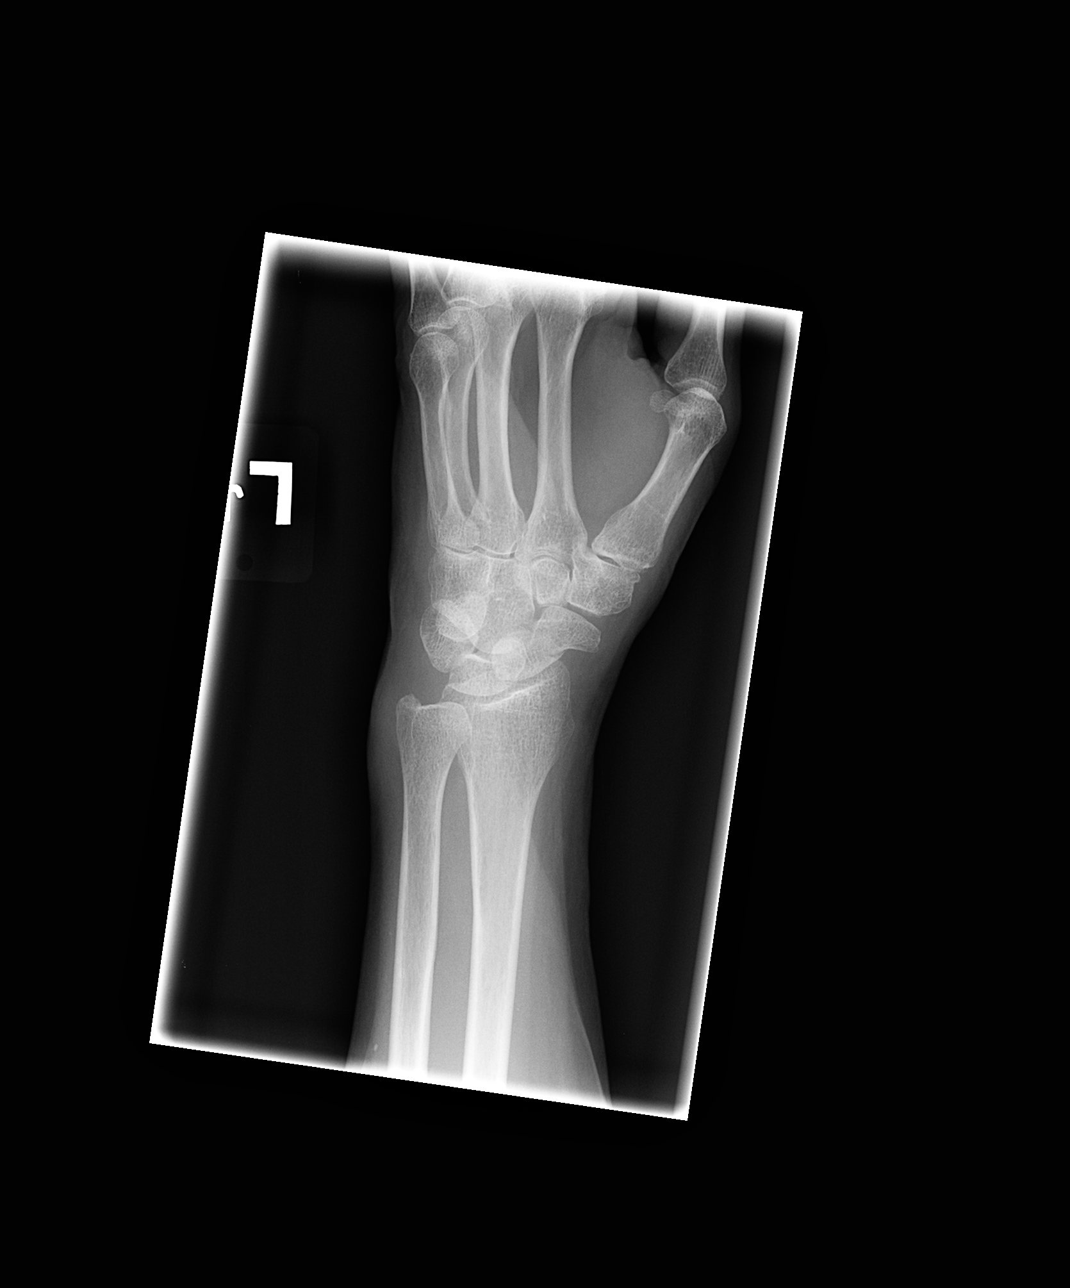

[view not recorded (3 of 4)]
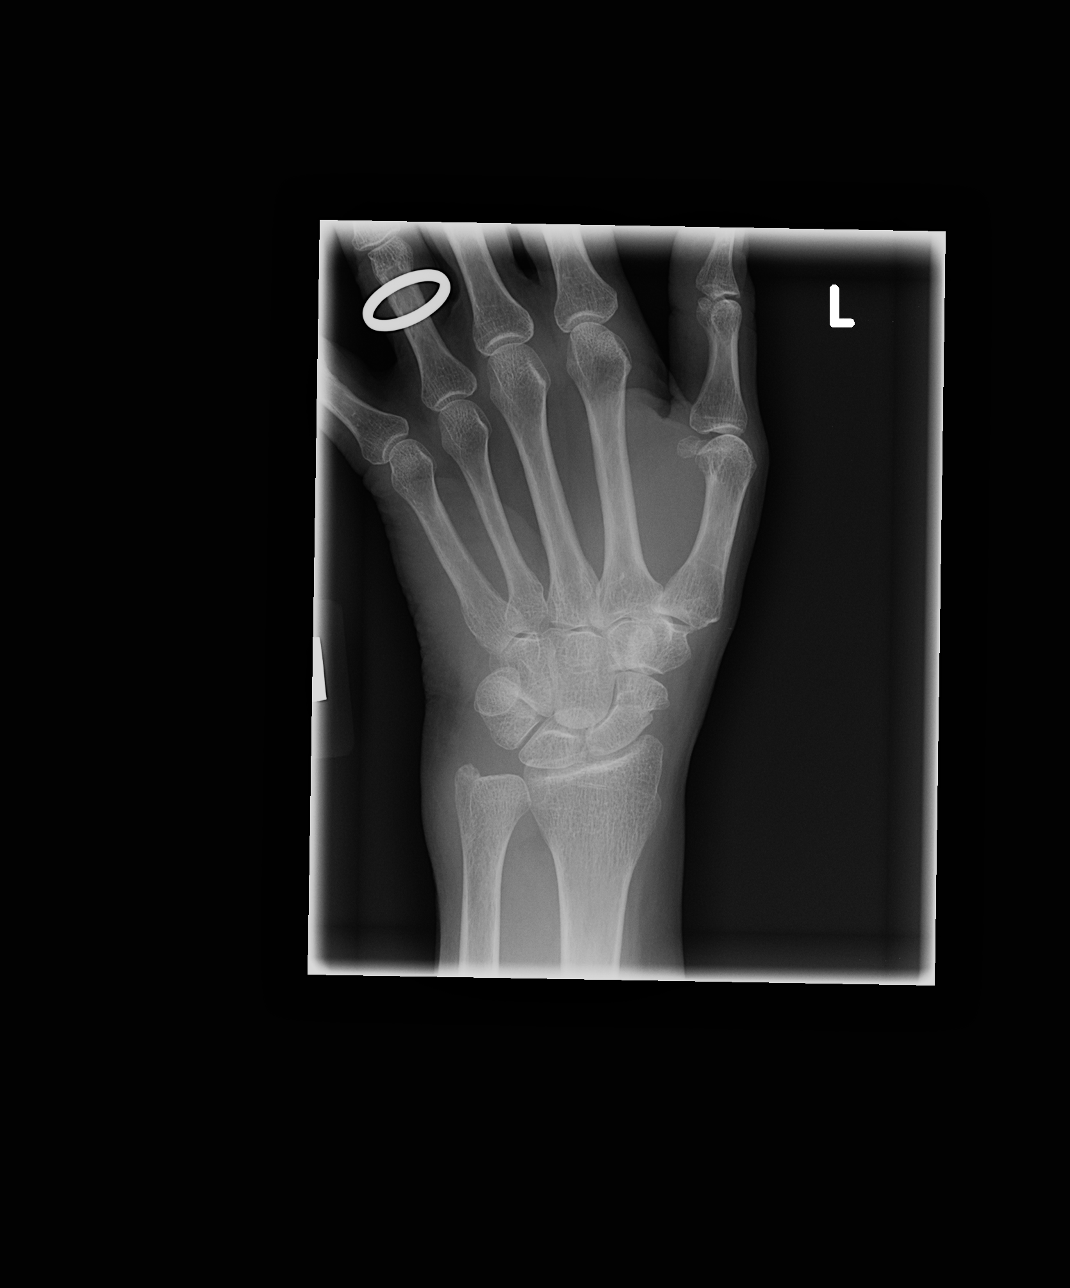

[view not recorded (4 of 4)]
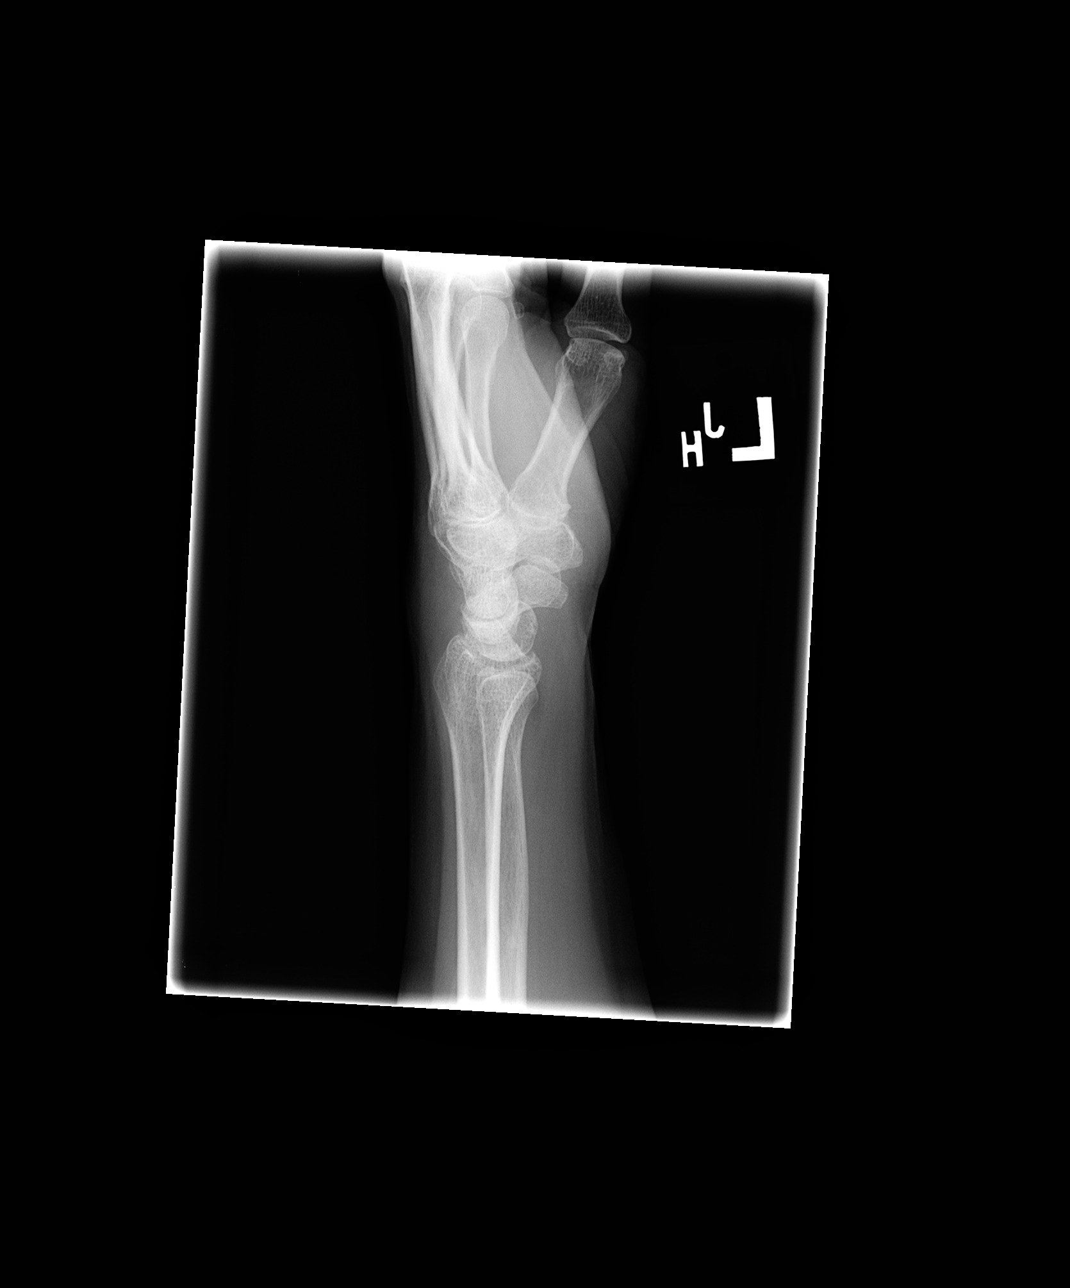

[4 of 4 positions shown; findings below may reference images not displayed]

FINDINGS: Linear lucency through scaphoid waist, though there is associated
mild degenerative change of this could reflect spurring. No
dislocation. No destructive bony lesions. Dorsomedial wrist soft
tissue swelling without subcutaneous gas or radiopaque foreign
bodies.
IMPRESSION: Possible nondisplaced scaphoid fracture, recommend correlation with
point tenderness. No dislocation.

  By: Valeriya Wooden

## 2016-08-15 ENCOUNTER — Other Ambulatory Visit (INDEPENDENT_AMBULATORY_CARE_PROVIDER_SITE_OTHER): Payer: Self-pay | Admitting: Otolaryngology

## 2016-08-15 DIAGNOSIS — H918X9 Other specified hearing loss, unspecified ear: Secondary | ICD-10-CM

## 2016-08-31 ENCOUNTER — Other Ambulatory Visit: Payer: Self-pay | Admitting: Internal Medicine

## 2016-10-21 ENCOUNTER — Other Ambulatory Visit: Payer: Self-pay | Admitting: Internal Medicine

## 2016-10-21 NOTE — Telephone Encounter (Signed)
Refill once only. 

## 2016-10-25 ENCOUNTER — Encounter: Payer: Self-pay | Admitting: Internal Medicine

## 2016-10-25 ENCOUNTER — Ambulatory Visit (INDEPENDENT_AMBULATORY_CARE_PROVIDER_SITE_OTHER): Payer: Federal, State, Local not specified - PPO | Admitting: Internal Medicine

## 2016-10-25 VITALS — BP 120/70 | HR 83 | Temp 99.7°F | Ht 67.0 in | Wt 159.5 lb

## 2016-10-25 DIAGNOSIS — J029 Acute pharyngitis, unspecified: Secondary | ICD-10-CM

## 2016-10-25 DIAGNOSIS — H6503 Acute serous otitis media, bilateral: Secondary | ICD-10-CM | POA: Diagnosis not present

## 2016-10-25 DIAGNOSIS — J069 Acute upper respiratory infection, unspecified: Secondary | ICD-10-CM | POA: Diagnosis not present

## 2016-10-25 LAB — POCT RAPID STREP A (OFFICE): Rapid Strep A Screen: NEGATIVE

## 2016-10-25 MED ORDER — HYDROCODONE-HOMATROPINE 5-1.5 MG/5ML PO SYRP: 5.0000 mL | ORAL_SOLUTION | Freq: Three times a day (TID) | ORAL | 0 refills | Status: DC | PRN

## 2016-10-25 MED ORDER — DOXYCYCLINE HYCLATE 100 MG PO TABS: 100.0000 mg | ORAL_TABLET | Freq: Two times a day (BID) | ORAL | 0 refills | Status: DC

## 2016-10-25 NOTE — Progress Notes (Signed)
   Subjective:    Patient ID: [name], female    DOB: [birth year], 71 y.o.   MRN: [id number]  HPI 4 day history of sore throat, cough, congestion and chills. Recently had an acute episode of hearing loss. She saw ENT, Dr. Avelina Laine who placed her on prednisone. She feels that the hearing loss may perhaps be permanent. ENT told her it could be due to a viral syndrome or perhaps Mnire's disease. She's being followed closely.  She did receive flu vaccine.  Has no myalgias but does have malaise and fatigue. Sounds nasally congested.    Review of Systems-see above     Objective:   Physical Exam Skin warm and dry. Nodes none. TMs are slightly full bilaterally. Pharynx is red. Rapid strep screen negative. Neck is supple without adenopathy. Chest clear to auscultation. She sounds nasally congested       Assessment & Plan:  Acute bilateral serous otitis media  Acute URI  Plan: Doxycycline 100 mg twice daily for 10 days. Hycodan 1 teaspoon by mouth every 8 hours when necessary cough. Rest and drink plenty of fluids.

## 2016-10-25 NOTE — Patient Instructions (Signed)
Doxycycline 100 mg twice daily for 10 days. Rapid strep screen is negative. Hycodan 1 teaspoon by mouth every 8 hours when necessary cough.

## 2016-12-30 ENCOUNTER — Other Ambulatory Visit: Payer: Federal, State, Local not specified - PPO | Admitting: Internal Medicine

## 2016-12-30 DIAGNOSIS — Z0000 Encounter for general adult medical examination without abnormal findings: Secondary | ICD-10-CM

## 2016-12-30 LAB — CBC WITH DIFFERENTIAL/PLATELET
BASOS PCT: 1 %
Basophils Absolute: 42 cells/uL (ref 0–200)
Eosinophils Absolute: 84 cells/uL (ref 15–500)
Eosinophils Relative: 2 %
HEMATOCRIT: 43 % (ref 35.0–45.0)
HEMOGLOBIN: 14.3 g/dL (ref 11.7–15.5)
LYMPHS ABS: 1512 {cells}/uL (ref 850–3900)
Lymphocytes Relative: 36 %
MCH: 30.4 pg (ref 27.0–33.0)
MCHC: 33.3 g/dL (ref 32.0–36.0)
MCV: 91.3 fL (ref 80.0–100.0)
MONO ABS: 294 {cells}/uL (ref 200–950)
MPV: 9.5 fL (ref 7.5–12.5)
Monocytes Relative: 7 %
Neutro Abs: 2268 cells/uL (ref 1500–7800)
Neutrophils Relative %: 54 %
Platelets: 205 10*3/uL (ref 140–400)
RBC: 4.71 MIL/uL (ref 3.80–5.10)
RDW: 13.1 % (ref 11.0–15.0)
WBC: 4.2 10*3/uL (ref 3.8–10.8)

## 2016-12-30 LAB — TSH: TSH: 3.09 mIU/L

## 2016-12-31 LAB — COMPREHENSIVE METABOLIC PANEL
ALBUMIN: 4.3 g/dL (ref 3.6–5.1)
ALK PHOS: 73 U/L (ref 33–130)
ALT: 18 U/L (ref 6–29)
AST: 23 U/L (ref 10–35)
BILIRUBIN TOTAL: 0.6 mg/dL (ref 0.2–1.2)
BUN: 14 mg/dL (ref 7–25)
CALCIUM: 9.4 mg/dL (ref 8.6–10.4)
CO2: 23 mmol/L (ref 20–31)
Chloride: 103 mmol/L (ref 98–110)
Creat: 0.74 mg/dL (ref 0.50–0.99)
GLUCOSE: 89 mg/dL (ref 65–99)
Potassium: 4.5 mmol/L (ref 3.5–5.3)
Sodium: 141 mmol/L (ref 135–146)
Total Protein: 6.1 g/dL (ref 6.1–8.1)

## 2016-12-31 LAB — LIPID PANEL
Cholesterol: 176 mg/dL (ref <200)
HDL: 68 mg/dL (ref >50)
LDL Cholesterol: 90 mg/dL (ref <100)
TRIGLYCERIDES: 92 mg/dL (ref <150)
Total CHOL/HDL Ratio: 2.6 Ratio (ref <5.0)
VLDL: 18 mg/dL (ref <30)

## 2016-12-31 LAB — VITAMIN D 25 HYDROXY (VIT D DEFICIENCY, FRACTURES): VIT D 25 HYDROXY: 36 ng/mL (ref 30–100)

## 2017-01-02 ENCOUNTER — Ambulatory Visit (INDEPENDENT_AMBULATORY_CARE_PROVIDER_SITE_OTHER): Payer: Federal, State, Local not specified - PPO | Admitting: Internal Medicine

## 2017-01-02 ENCOUNTER — Encounter: Payer: Self-pay | Admitting: Internal Medicine

## 2017-01-02 VITALS — BP 112/70 | HR 70 | Temp 98.5°F

## 2017-01-02 DIAGNOSIS — R8290 Unspecified abnormal findings in urine: Secondary | ICD-10-CM

## 2017-01-02 DIAGNOSIS — R350 Frequency of micturition: Secondary | ICD-10-CM

## 2017-01-02 LAB — POCT URINALYSIS DIPSTICK
Bilirubin, UA: NEGATIVE
Glucose, UA: NEGATIVE
KETONES UA: NEGATIVE
Nitrite, UA: NEGATIVE
PH UA: 6 (ref 5.0–8.0)
Protein, UA: NEGATIVE
SPEC GRAV UA: 1.01 (ref 1.010–1.025)
Urobilinogen, UA: 0.2 E.U./dL

## 2017-01-02 NOTE — Progress Notes (Signed)
   Subjective:    Patient ID: [name], female    DOB: 1946/01/28, 71 y.o.   MRN: [id number]  HPI Patient here with dysuria and urinary frequency onset early this morning. No fever or chills. No CVA tenderness. She has a prescription for Keflex which we have given her in the past when she was symptomatic. Is intolerant of Macrodantin, sulfa and Avelox. Avelox causes a rash, Sulfa causes her lip to swell.  Says she's feeling a bit better than earlier this morning.    Review of Systems see above     Objective:   Physical Exam  No CVA tenderness. Urine dipstick shows trace LE and moderate hemolyzed occult blood. Urine sent for culture.      Assessment & Plan:  Dysuria  Urinary frequency  Plan: Await urine culture results. She is returning tomorrow anyway for previously scheduled physical examination.  Addendum: She called shortly after 4 PM saying she was more symptomatic with urinary frequency and dysuria. She starting Keflex prescription she already had at home for travel. We will follow-up tomorrow.

## 2017-01-02 NOTE — Patient Instructions (Addendum)
Patient came in c/o urinary frequency and burning when urinating. Urine was + for leukocytes. Urine sent for culture.   Addendum: Patient will start Keflex this evening as she is now symptomatic. Culture is pending.  Physical exam already previously scheduled for tomorrow

## 2017-01-03 ENCOUNTER — Encounter: Payer: Self-pay | Admitting: Internal Medicine

## 2017-01-03 ENCOUNTER — Ambulatory Visit (INDEPENDENT_AMBULATORY_CARE_PROVIDER_SITE_OTHER): Payer: Federal, State, Local not specified - PPO | Admitting: Internal Medicine

## 2017-01-03 VITALS — BP 110/70 | HR 71 | Temp 98.8°F | Ht 66.25 in | Wt 159.0 lb

## 2017-01-03 DIAGNOSIS — K219 Gastro-esophageal reflux disease without esophagitis: Secondary | ICD-10-CM

## 2017-01-03 DIAGNOSIS — G4709 Other insomnia: Secondary | ICD-10-CM

## 2017-01-03 DIAGNOSIS — Z853 Personal history of malignant neoplasm of breast: Secondary | ICD-10-CM

## 2017-01-03 DIAGNOSIS — E7849 Other hyperlipidemia: Secondary | ICD-10-CM

## 2017-01-03 DIAGNOSIS — N390 Urinary tract infection, site not specified: Secondary | ICD-10-CM | POA: Diagnosis not present

## 2017-01-03 DIAGNOSIS — Z0000 Encounter for general adult medical examination without abnormal findings: Secondary | ICD-10-CM

## 2017-01-03 DIAGNOSIS — E784 Other hyperlipidemia: Secondary | ICD-10-CM

## 2017-01-03 DIAGNOSIS — Z882 Allergy status to sulfonamides status: Secondary | ICD-10-CM

## 2017-01-03 DIAGNOSIS — M8580 Other specified disorders of bone density and structure, unspecified site: Secondary | ICD-10-CM

## 2017-01-03 LAB — POCT URINALYSIS DIPSTICK
Bilirubin, UA: NEGATIVE
Glucose, UA: NEGATIVE
Ketones, UA: NEGATIVE
Leukocytes, UA: NEGATIVE
NITRITE UA: NEGATIVE
PH UA: 6 (ref 5.0–8.0)
Protein, UA: NEGATIVE
RBC UA: NEGATIVE
SPEC GRAV UA: 1.01 (ref 1.010–1.025)
UROBILINOGEN UA: 0.2 U/dL

## 2017-01-03 NOTE — Progress Notes (Addendum)
Subjective:    Patient ID: [name], female    DOB: 12-01-1945, 71 y.o.   MRN: [id number]  HPI  71 year old Female for health maintenance exam and evaluation of medical issues.She has a history of hyperlipidemia, history of breast cancer, osteopenia and insomnia. She has had issues with left serous otitis media on several occasions.  She is not on Medicare.  His urinary tract infections from time to time. At one point was on trimethoprim sulfa but developed a swollen lower lip and tingling sensation. We decided it was an allergic reaction to sulfa and labeled her allergic to sulfa. She is to take trimethoprim sulfa prophylactically after intercourse and had not had any recent urinary tract infections up until late February 2017. Now she is treated with cephalosporins when she has an infection.  She had bilateral mastectomies 1993 by Dr. Margot Chimes. She is had chemotherapy for breast cancer. 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  Colonoscopy to Dr. Earlean Shawl in 2014  She had a uterine polyp removed in 2010, fractured toe 2011, left frozen shoulder 1993. She is intolerant of Macrodantin as it reportedly causes angioedema symptoms with lip swelling. She cannot take Avelox either. This limits the antibiotics that she can can take without significant side effects.  Social history: She formerly worked as a Copywriter, advertising and a Education officer, museum. Husband is an Systems developer judge for the [employer]. She does not smoke. Social alcohol consumption. One daughter.  Family history: Father living with history of MI, hypertension and stroke. He is in an assisted living facility and is 59 years old. Mother died at age 48 of a stroke.  Patient is status post cataract surgery by Dr. Katy Fitch. She sees Dr. Delman Cheadle for routine eye exams.  History of sinusitis January 2016 treated with Zithromax Z-PAK and Depo-Medrol.    Review of Systems    Constitutional: Negative.   HENT: Negative.   Respiratory: Negative.   Cardiovascular: Negative.   Gastrointestinal: Negative.   Genitourinary:       Presented yesterday with UTI symptoms. She decided to take some Keflex although initially wanted her to wait and see what culture grew. She felt she could not wait to start medication and was seen yesterday. Culture is pending  Neurological: Negative.   Hematological: Negative.   Psychiatric/Behavioral:       Somewhat anxious       Objective:   Physical Exam  Constitutional: She is oriented to person, place, and time. She appears well-developed and well-nourished. No distress.  HENT:  Head: Normocephalic and atraumatic.  Right Ear: External ear normal.  Left Ear: External ear normal.  Mouth/Throat: Oropharynx is clear and moist. No oropharyngeal exudate.  Eyes: Conjunctivae and EOM are normal. Pupils are equal, round, and reactive to light. Right eye exhibits no discharge. Left eye exhibits no discharge. No scleral icterus.  Neck: Neck supple. No JVD present. No thyromegaly present.  Cardiovascular: Normal rate, regular rhythm, normal heart sounds and intact distal pulses.   No murmur heard. Pulmonary/Chest: Effort normal and breath sounds normal. No respiratory distress. She has no wheezes. She has no rales.  Bilateral mastectomies  Abdominal: Soft. Bowel sounds are normal. She exhibits no distension and no mass. There is no tenderness. There is no guarding.  Genitourinary:  Genitourinary Comments: Deferred to GYN  Musculoskeletal: Normal range of motion. She exhibits no edema.  Lymphadenopathy:    She has no cervical adenopathy.  Neurological:  She is alert and oriented to person, place, and time. She has normal reflexes. No cranial nerve deficit. Coordination normal.  Skin: Skin is warm and dry. No rash noted. She is not diaphoretic.  Psychiatric: She has a normal mood and affect. Her behavior is normal. Judgment and thought  content normal.  Vitals reviewed.         Assessment & Plan:  History of breast cancer status post bilateral mastectomies  Hyperlipidemia  Family history of heart disease and stroke  Sulfa allergy  History of recurrent urinary tract infections  Osteopenia  History of insomnia  Multiple drug intolerances including Macrodantin sulfa and Avelox    Plan: Return in one year or as needed.  Addendum: Urine culture grew greater than 100,000 colonies per milliliter Citrobacter. She has an MIC of less than 4 so according to notes from lab it is likely that cefazolin will work for her in this case

## 2017-01-04 LAB — URINE CULTURE

## 2017-01-05 ENCOUNTER — Telehealth: Payer: Self-pay | Admitting: Internal Medicine

## 2017-01-05 ENCOUNTER — Other Ambulatory Visit: Payer: Self-pay | Admitting: Internal Medicine

## 2017-01-05 ENCOUNTER — Encounter: Payer: Self-pay | Admitting: Internal Medicine

## 2017-01-05 DIAGNOSIS — M8580 Other specified disorders of bone density and structure, unspecified site: Secondary | ICD-10-CM

## 2017-01-05 NOTE — Telephone Encounter (Signed)
Culture grew Citrobacter. She has multiple drug intolerances. Is feeling better. Rather than change antibiotics, we have discussed that will continue with Keflex for now, See culture and sensitivity report. Thought about switching to Augmentin but will stay with Keflex for now.

## 2017-01-12 ENCOUNTER — Ambulatory Visit
Admission: RE | Admit: 2017-01-12 | Discharge: 2017-01-12 | Disposition: A | Discharge status: Home / Self Care | Payer: Federal, State, Local not specified - PPO | Source: Ambulatory Visit | Attending: Internal Medicine | Admitting: Internal Medicine

## 2017-01-12 DIAGNOSIS — M8580 Other specified disorders of bone density and structure, unspecified site: Secondary | ICD-10-CM

## 2017-01-25 NOTE — Patient Instructions (Signed)
Is a pleasure to see you today. Continue Keflex course for UTI. Return in one year or as needed.

## 2017-02-06 ENCOUNTER — Other Ambulatory Visit: Payer: Self-pay | Admitting: Internal Medicine

## 2017-02-06 NOTE — Telephone Encounter (Signed)
Cannot take Ambien and Lorazepam together

## 2017-02-06 NOTE — Telephone Encounter (Signed)
Patient would like a refill on her Ambien medication.

## 2017-02-06 NOTE — Telephone Encounter (Signed)
May have either Ambien or Lorazepam. May have onetime refill on Keflex.

## 2017-02-06 NOTE — Telephone Encounter (Signed)
Pt is aware that she has not had this filled since 2015 and she said she is traveling and normally takes Ambien with her along with Lorazepam. She alternates the one she takes based on her need. She sent a second refill request for the Lorazepam, Atorvastatin, and Keflex. Keflex was a prescription she states was given to her for travel but she states she used it last time she came in and had a UTI. Please advise on theses 4 refills.

## 2017-02-06 NOTE — Telephone Encounter (Signed)
Refilled Lorazepam, Keflex, and Atorvastatin. Did not refill Ambien. Called pt and LVM to call back

## 2017-02-06 NOTE — Telephone Encounter (Signed)
Please call pt. Ambien not listed.

## 2017-03-06 ENCOUNTER — Telehealth: Payer: Self-pay

## 2017-03-06 DIAGNOSIS — Z1239 Encounter for other screening for malignant neoplasm of breast: Secondary | ICD-10-CM

## 2017-03-06 NOTE — Telephone Encounter (Signed)
Order for mammogram placed and letter sent

## 2017-03-09 ENCOUNTER — Encounter: Payer: Self-pay | Admitting: Internal Medicine

## 2017-03-15 ENCOUNTER — Encounter: Payer: Self-pay | Admitting: Internal Medicine

## 2017-05-15 ENCOUNTER — Other Ambulatory Visit: Payer: Self-pay | Admitting: Internal Medicine

## 2017-05-15 DIAGNOSIS — Z1231 Encounter for screening mammogram for malignant neoplasm of breast: Secondary | ICD-10-CM

## 2017-05-16 ENCOUNTER — Telehealth: Payer: Self-pay

## 2017-05-16 NOTE — Telephone Encounter (Signed)
Pt has a bilateral mastectomy and that is noted in the quality metrics

## 2017-06-08 NOTE — Telephone Encounter (Signed)
[name], please close this encounter.  I can't close it.  Thanx

## 2017-07-25 ENCOUNTER — Ambulatory Visit: Payer: Federal, State, Local not specified - PPO | Admitting: Internal Medicine

## 2017-07-25 ENCOUNTER — Encounter: Payer: Self-pay | Admitting: Internal Medicine

## 2017-07-25 VITALS — BP 100/80 | HR 68 | Temp 98.3°F | Wt 160.0 lb

## 2017-07-25 DIAGNOSIS — R05 Cough: Secondary | ICD-10-CM

## 2017-07-25 DIAGNOSIS — J0100 Acute maxillary sinusitis, unspecified: Secondary | ICD-10-CM | POA: Diagnosis not present

## 2017-07-25 MED ORDER — AZITHROMYCIN 250 MG PO TABS: ORAL_TABLET | ORAL | 0 refills | Status: DC

## 2017-07-25 MED ORDER — METHYLPREDNISOLONE ACETATE 80 MG/ML IJ SUSP
80.0000 mg | Freq: Once | INTRAMUSCULAR | Status: AC
  Administered 2017-07-25: 80 mg via INTRAMUSCULAR

## 2017-07-25 NOTE — Addendum Note (Signed)
Addended by: Mady Haagensen on: 07/25/2017 04:28 PM   Modules accepted: Orders

## 2017-07-25 NOTE — Patient Instructions (Signed)
Depo-Medrol 80 mg IM.  Zithromax Z-Pak take 2 p.o. day 1 followed by 1 p.o. days 2 through 5

## 2017-07-25 NOTE — Progress Notes (Signed)
   Subjective:    Patient ID: [name], female    DOB: November 24, 1945, 71 y.o.   MRN: [id number]  HPI 1 week history of URI symptoms.  Has been very hoarse.  Initially started his sore throat.  Now has some discolored nasal drainage.  Some cough that she has been taking prescription cough medication at night.  No fever or shaking chills.  She rested for several days and initially thought she was getting better but now does not seem to be getting better and is still very hoarse when she speaks and nasally congested.    Review of Systems     Objective:   Physical Exam TMs are clear.  Pharynx is clear.  Neck is supple without adenopathy.  Chest clear to auscultation.  Sounds hoarse congested when she speaks       Assessment & Plan:  Acute sinusitis  Plan: Depo-Medrol 80 mg IM.  Zithromax Z-Pak take 2 tablets day 1 followed by 1 tablet days 2 through 5.

## 2017-08-05 ENCOUNTER — Other Ambulatory Visit: Payer: Self-pay | Admitting: Internal Medicine

## 2017-10-06 ENCOUNTER — Emergency Department (HOSPITAL_COMMUNITY): Payer: Federal, State, Local not specified - PPO

## 2017-10-06 ENCOUNTER — Other Ambulatory Visit: Payer: Self-pay

## 2017-10-06 ENCOUNTER — Encounter (HOSPITAL_COMMUNITY): Payer: Self-pay

## 2017-10-06 ENCOUNTER — Emergency Department (HOSPITAL_COMMUNITY)
Admission: EM | Admit: 2017-10-06 | Discharge: 2017-10-07 | Disposition: A | Discharge status: Home / Self Care | Payer: Federal, State, Local not specified - PPO | Attending: Emergency Medicine | Admitting: Emergency Medicine

## 2017-10-06 DIAGNOSIS — R61 Generalized hyperhidrosis: Secondary | ICD-10-CM | POA: Insufficient documentation

## 2017-10-06 DIAGNOSIS — Z853 Personal history of malignant neoplasm of breast: Secondary | ICD-10-CM | POA: Insufficient documentation

## 2017-10-06 DIAGNOSIS — Z87891 Personal history of nicotine dependence: Secondary | ICD-10-CM | POA: Diagnosis not present

## 2017-10-06 DIAGNOSIS — Z79899 Other long term (current) drug therapy: Secondary | ICD-10-CM | POA: Insufficient documentation

## 2017-10-06 DIAGNOSIS — R079 Chest pain, unspecified: Secondary | ICD-10-CM | POA: Diagnosis not present

## 2017-10-06 LAB — BASIC METABOLIC PANEL
Anion gap: 12 (ref 5–15)
BUN: 12 mg/dL (ref 6–20)
CHLORIDE: 98 mmol/L — AB (ref 101–111)
CO2: 26 mmol/L (ref 22–32)
Calcium: 9.8 mg/dL (ref 8.9–10.3)
Creatinine, Ser: 0.73 mg/dL (ref 0.44–1.00)
GFR calc non Af Amer: >60 mL/min (ref >60)
Glucose, Bld: 125 mg/dL — ABNORMAL HIGH (ref 65–99)
POTASSIUM: 4.4 mmol/L (ref 3.5–5.1)
Sodium: 136 mmol/L (ref 135–145)

## 2017-10-06 LAB — CBC
HEMATOCRIT: 43.5 % (ref 36.0–46.0)
HEMOGLOBIN: 14.9 g/dL (ref 12.0–15.0)
MCH: 31.3 pg (ref 26.0–34.0)
MCHC: 34.3 g/dL (ref 30.0–36.0)
MCV: 91.4 fL (ref 78.0–100.0)
PLATELETS: 205 10*3/uL (ref 150–400)
RBC: 4.76 MIL/uL (ref 3.87–5.11)
RDW: 12.2 % (ref 11.5–15.5)
WBC: 6.4 10*3/uL (ref 4.0–10.5)

## 2017-10-06 LAB — I-STAT TROPONIN, ED
TROPONIN I, POC: 0 ng/mL (ref 0.00–0.08)
Troponin i, poc: 0 ng/mL (ref 0.00–0.08)

## 2017-10-06 NOTE — ED Notes (Signed)
Family up to desk asking about wait time, patient made aware and remains in waiting room at this time

## 2017-10-06 NOTE — ED Notes (Signed)
Pt remains in waiting room. Updated on wait for treatment room. 

## 2017-10-06 NOTE — ED Triage Notes (Signed)
Per Pt, Pt woke up this morning with what "felt like heart burn" that subsided with drinking water. While getting up to take a shower, pt started to have chest pain and shoulder pain along with diaphoresis. All symptoms have resolved. Denies SOB, N/V/D.

## 2017-10-07 NOTE — Discharge Instructions (Signed)
Your doctor Monday morning to be seen for a recheck.  If you have any further episodes of chest pain, return to the ER.

## 2017-10-07 NOTE — ED Notes (Signed)
ED Provider at bedside. 

## 2017-10-07 NOTE — ED Provider Notes (Signed)
[facility] Provider Note   CSN: [id number] Arrival date & time: 10/06/17  1410     History   Chief Complaint Chief Complaint  Patient presents with  . Chest Pain    HPI [name] is a 72 y.o. female.  Patient presents to the ER for evaluation of chest pain.  Patient had an episode of discomfort that she thought was heartburn this morning when she woke up.  She drank some water and it subsided, was only present briefly.  Then later in the morning she had an episode of pain in her chest and into the left shoulder and got very sweaty.  This also was short-lived and spontaneously resolved.  No associated nausea or shortness of breath.  She has not had any recurrence of symptoms since this morning.      Past Medical History:  Diagnosis Date  . Allergy   . Arthritis   . Breast cancer ([place]) 1993  . Cancer ([facility])    breast  . Chronic UTI   . Dyspareunia   . Eustachian tube dysfunction   . Hyperlipidemia   . Osteopenia   . Rhinitis, nonallergic     Patient Active Problem List   Diagnosis Date Noted  . NS (nuclear sclerosis) 01/14/2015  . Insomnia 09/09/2014  . RLQ abdominal pain 12/19/2012  . Postmenopausal atrophic vaginitis 12/19/2012  . History of breast cancer 06/25/2011  . Hyperlipidemia 06/25/2011  . Osteopenia 06/25/2011  . Recurrent urinary tract infection 06/25/2011  . Rhinitis 06/25/2011  . Eustachian tube dysfunction 06/25/2011  . Anxiety 06/25/2011    Past Surgical History:  Procedure Laterality Date  . BREAST SURGERY    . endometrial polyp  2010   Dr. Quincy Simmonds  . MASTECTOMY  1993   bilateral    OB History    Gravida Para Term Preterm AB Living   1 1 1     1    SAB TAB Ectopic Multiple Live Births                   Home Medications    Prior to Admission medications   Medication Sig Start Date End Date Taking? Authorizing Provider  atorvastatin (LIPITOR) 10 MG tablet TAKE 1 TABLET BY MOUTH EVERY  DAY Patient taking differently: Take 10 mg by mouth once a day 08/05/17  Yes Baxley, Cresenciano Lick, MD  cetirizine (ZYRTEC) 10 MG tablet Take 5 mg by mouth at bedtime.    Yes [provider]  Cholecalciferol (VITAMIN D3) 2000 units TABS Take 2,000 Units by mouth daily.   Yes [provider]  Cranberry 250 MG TABS Take 250 mg by mouth 2 (two) times daily.    Yes [provider]  estradiol (ESTRACE) 0.1 MG/GM vaginal cream Place 1 Applicatorful vaginally 2 (two) times a week.    Yes [provider]  fish oil-omega-3 fatty acids 1000 MG capsule Take 1 g by mouth daily.    Yes [provider]  fluticasone (FLONASE) 50 MCG/ACT nasal spray Place 2 sprays into both nostrils daily.   Yes [provider]  LORazepam (ATIVAN) 1 MG tablet TAKE 1 TABLET BY MOUTH AT BEDTIME Patient taking differently: Take 1 mg by mouth once a day as needed for anxiety 02/06/17  Yes Baxley, Cresenciano Lick, MD  meclizine (ANTIVERT) 25 MG tablet TAKE 1 TABLET (25 MG TOTAL) BY MOUTH 3 (THREE) TIMES DAILY AS NEEDED FOR DIZZINESS. 08/31/16  Yes Baxley, Cresenciano Lick, MD  montelukast (  SINGULAIR) 10 MG tablet Take 10 mg by mouth every evening. 07/28/14  Yes [provider]  Multiple Vitamin (MULTIVITAMIN) tablet Take 1 tablet by mouth daily.   Yes [provider]  Propylene Glycol (SYSTANE BALANCE) 0.6 % SOLN Place 1-2 drops into both eyes daily as needed (for dryness).    Yes [provider]  azithromycin (ZITHROMAX) 250 MG tablet 2 po day 1 followed by one po days 2-5 Patient not taking: Reported on 10/06/2017 07/25/17   Elby Showers, MD  cephALEXin (KEFLEX) 250 MG capsule TAKE 1 CAPSULE (250 MG TOTAL) BY MOUTH 4 (FOUR) TIMES DAILY. Patient not taking: Reported on 10/06/2017 02/06/17   Elby Showers, MD  VERAMYST 27.5 MCG/SPRAY nasal spray USE 1 SPRAY IN [facility] NOSTRIL DAILY Patient not taking: Reported on 10/06/2017 08/07/15   Elby Showers, MD    Family History Family History   Problem Relation Age of Onset  . Stroke Mother   . Hypertension Mother   . Hypertension Father   . Prostate cancer Father 36  . Diabetes Maternal Grandfather   . Breast cancer Paternal Grandmother        dx in her 26s    Social History Social History   Tobacco Use  . Smoking status: Former Smoker    Packs/day: 0.50    Years: 3.50    Pack years: 1.75    Types: Cigarettes    Last attempt to quit: 06/22/1964    Years since quitting: 53.3  . Smokeless tobacco: Never Used  Substance Use Topics  . Alcohol use: Yes    Comment: social  . Drug use: No     Allergies   Macrodantin; Nitrofuran derivatives; Sulfa antibiotics; and Avelox [moxifloxacin hcl in nacl]   Review of Systems Review of Systems  Constitutional: Positive for diaphoresis.  Respiratory: Negative for shortness of breath.   Cardiovascular: Positive for chest pain.  Gastrointestinal: Negative for nausea.  All other systems reviewed and are negative.    Physical Exam Updated Vital Signs BP 134/79   Pulse 63   Temp 98.2 F (36.8 C) (Oral)   Resp 17   Ht 5\' 7"  (1.702 m)   Wt 72.6 kg (160 lb)   SpO2 99%   BMI 25.06 kg/m   Physical Exam  Constitutional: She is oriented to person, place, and time. She appears well-developed and well-nourished. No distress.  HENT:  Head: Normocephalic and atraumatic.  Right Ear: Hearing normal.  Left Ear: Hearing normal.  Nose: Nose normal.  Mouth/Throat: Oropharynx is clear and moist and mucous membranes are normal.  Eyes: Conjunctivae and EOM are normal. Pupils are equal, round, and reactive to light.  Neck: Normal range of motion. Neck supple.  Cardiovascular: Regular rhythm, S1 normal and S2 normal. Exam reveals no gallop and no friction rub.  No murmur heard. Pulmonary/Chest: Effort normal and breath sounds normal. No respiratory distress. She exhibits no tenderness.  Abdominal: Soft. Normal appearance and bowel sounds are normal. There is no  hepatosplenomegaly. There is no tenderness. There is no rebound, no guarding, no tenderness at McBurney's point and negative Murphy's sign. No hernia.  Musculoskeletal: Normal range of motion.  Neurological: She is alert and oriented to person, place, and time. She has normal strength. No cranial nerve deficit or sensory deficit. Coordination normal. GCS eye subscore is 4. GCS verbal subscore is 5. GCS motor subscore is 6.  Skin: Skin is warm, dry and intact. No rash noted. No cyanosis.  Psychiatric: She has  a normal mood and affect. Her speech is normal and behavior is normal. Thought content normal.  Nursing note and vitals reviewed.    ED Treatments / Results  Labs (all labs ordered are listed, but only abnormal results are displayed) Labs Reviewed  BASIC METABOLIC PANEL - Abnormal; Notable for the following components:      Result Value   Chloride 98 (*)    Glucose, Bld 125 (*)    All other components within normal limits  CBC  I-STAT TROPONIN, ED  I-STAT TROPONIN, ED    EKG  EKG Interpretation  Date/Time:  Friday October 06 2017 14:19:41 EST Ventricular Rate:  79 PR Interval:  128 QRS Duration: 76 QT Interval:  382 QTC Calculation: 438 R Axis:   72 Text Interpretation:  Normal sinus rhythm Septal infarct , age undetermined Abnormal ECG No significant change since last tracing Confirmed by Orpah Greek [phone]) on 10/07/2017 12:14:01 AM       Radiology Dg Chest 2 View  Result Date: 10/06/2017 CLINICAL DATA:  Chest pain. EXAM: CHEST  2 VIEW COMPARISON:  Chest x-ray dated Jan 13, 2014. FINDINGS: The heart size and mediastinal contours are within normal limits. Normal pulmonary vascularity. No focal consolidation, pleural effusion, or pneumothorax. No acute osseous abnormality. IMPRESSION: No active cardiopulmonary disease. Electronically Signed   By: Titus Dubin M.D.   On: 10/06/2017 14:46    Procedures Procedures (including critical care time)  Medications  Ordered in ED Medications - No data to display   Initial Impression / Assessment and Plan / ED Course  I have reviewed the triage vital signs and the nursing notes.  Pertinent labs & imaging results that were available during my care of the patient were reviewed by me and considered in my medical decision making (see chart for details).     Patient presents to the ER for evaluation of chest pain.  Patient reports that she is active, plays tennis regularly.  She never has exertional chest pain.  She had 2 brief episodes of discomfort earlier this morning.  Both spontaneously resolved and she has not had any pain since (episode was more than 12 hours ago).  Patient does not have ischemia or infarct evident on EKG.  She has had 2- troponins here in the ER. HEART score is 3, felt to be low risk and therefore outpatient follow-up will be adequate.  Patient was counseled to call her doctor Monday morning.  In the event that her doctor cannot see her, was referred to cardiology.  Patient counseled to return to the ER for any recurrence of her chest pain.  Final Clinical Impressions(s) / ED Diagnoses   Final diagnoses:  Chest pain, unspecified type    ED Discharge Orders    None       Pollina, Gwenyth Allegra, MD 10/07/17 [phone]

## 2017-10-31 ENCOUNTER — Ambulatory Visit: Payer: Federal, State, Local not specified - PPO | Admitting: Internal Medicine

## 2017-10-31 ENCOUNTER — Encounter: Payer: Self-pay | Admitting: Internal Medicine

## 2017-10-31 VITALS — BP 120/70 | HR 74 | Ht 66.25 in | Wt 160.0 lb

## 2017-10-31 DIAGNOSIS — R079 Chest pain, unspecified: Secondary | ICD-10-CM | POA: Diagnosis not present

## 2017-11-08 ENCOUNTER — Other Ambulatory Visit: Payer: Self-pay

## 2017-11-08 MED ORDER — ATORVASTATIN CALCIUM 10 MG PO TABS: 10.0000 mg | ORAL_TABLET | Freq: Every day | ORAL | 3 refills | Status: DC

## 2017-11-22 NOTE — Progress Notes (Signed)
   Subjective:    Patient ID: [name], female    DOB: Mar 22, 1946, 72 y.o.   MRN: [id number]  HPI 72 year old female was seen in the Emergency Department February 8  regarding chest and left shoulder pain.  Initially thought it was heartburn.  She noted discomfort when she awakened on the morning of February 8.  Had some diaphoresis later in the morning pain moved from chest into her left shoulder.  Had no nausea or shortness of breath.  Symptoms improved but she was alarmed and came to the emergency department.  In the emergency department her potassium, BUN and creatinine were normal.  CBC was normal.  Troponin x2 were negative.  Patient has family history of hypertension in mother and father and stroke in mother.  She is a former smoker.  History of hyperlipidemia.  EKG was normal.  Review of Systems see above-patient has had no recurrence of the symptoms.     Objective:   Physical Exam  Skin warm and dry.  Nodes none.  Chest clear.  Cardiac exam regular rate and rhythm.  No JVD thyromegaly or carotid bruits.  Extremities without edema.     Assessment & Plan:  Chest pain February 8 presented to emergency department and MI ruled out  Plan: Patient offered referral to Cardiologist.  She will consider it.  She is returning here in May for physical exam.  She will call with symptoms recur.

## 2017-11-22 NOTE — Patient Instructions (Signed)
Call if symptoms recur.  Physical exam appointment for early May.

## 2017-11-23 ENCOUNTER — Other Ambulatory Visit: Payer: Self-pay | Admitting: Internal Medicine

## 2017-11-23 NOTE — Telephone Encounter (Signed)
Refill x 6 months 

## 2017-12-26 ENCOUNTER — Other Ambulatory Visit: Payer: Self-pay | Admitting: Internal Medicine

## 2017-12-26 DIAGNOSIS — G4709 Other insomnia: Secondary | ICD-10-CM

## 2017-12-26 DIAGNOSIS — K219 Gastro-esophageal reflux disease without esophagitis: Secondary | ICD-10-CM

## 2017-12-26 DIAGNOSIS — Z853 Personal history of malignant neoplasm of breast: Secondary | ICD-10-CM

## 2017-12-26 DIAGNOSIS — E7849 Other hyperlipidemia: Secondary | ICD-10-CM

## 2017-12-26 DIAGNOSIS — Z0000 Encounter for general adult medical examination without abnormal findings: Secondary | ICD-10-CM

## 2017-12-26 DIAGNOSIS — M8580 Other specified disorders of bone density and structure, unspecified site: Secondary | ICD-10-CM

## 2018-01-09 ENCOUNTER — Other Ambulatory Visit: Payer: Federal, State, Local not specified - PPO | Admitting: Internal Medicine

## 2018-01-09 DIAGNOSIS — E7849 Other hyperlipidemia: Secondary | ICD-10-CM

## 2018-01-09 DIAGNOSIS — G4709 Other insomnia: Secondary | ICD-10-CM

## 2018-01-09 DIAGNOSIS — M8580 Other specified disorders of bone density and structure, unspecified site: Secondary | ICD-10-CM

## 2018-01-09 DIAGNOSIS — K219 Gastro-esophageal reflux disease without esophagitis: Secondary | ICD-10-CM

## 2018-01-09 DIAGNOSIS — Z853 Personal history of malignant neoplasm of breast: Secondary | ICD-10-CM

## 2018-01-09 DIAGNOSIS — Z0000 Encounter for general adult medical examination without abnormal findings: Secondary | ICD-10-CM

## 2018-01-09 LAB — COMPLETE METABOLIC PANEL WITH GFR
AG Ratio: 2.5 (calc) (ref 1.0–2.5)
ALBUMIN MSPROF: 4.3 g/dL (ref 3.6–5.1)
ALKALINE PHOSPHATASE (APISO): 78 U/L (ref 33–130)
ALT: 17 U/L (ref 6–29)
AST: 20 U/L (ref 10–35)
BUN: 15 mg/dL (ref 7–25)
CO2: 29 mmol/L (ref 20–32)
CREATININE: 0.69 mg/dL (ref 0.60–0.93)
Calcium: 9.3 mg/dL (ref 8.6–10.4)
Chloride: 100 mmol/L (ref 98–110)
GFR, Est African American: 102 mL/min/{1.73_m2} (ref >=60)
GFR, Est Non African American: 88 mL/min/{1.73_m2} (ref >=60)
GLUCOSE: 91 mg/dL (ref 65–99)
Globulin: 1.7 g/dL (calc) — ABNORMAL LOW (ref 1.9–3.7)
Potassium: 4.4 mmol/L (ref 3.5–5.3)
Sodium: 136 mmol/L (ref 135–146)
Total Bilirubin: 0.7 mg/dL (ref 0.2–1.2)
Total Protein: 6 g/dL — ABNORMAL LOW (ref 6.1–8.1)

## 2018-01-09 LAB — CBC WITH DIFFERENTIAL/PLATELET
BASOS PCT: 0.7 %
Basophils Absolute: 32 cells/uL (ref 0–200)
Eosinophils Absolute: 51 cells/uL (ref 15–500)
Eosinophils Relative: 1.1 %
HEMATOCRIT: 40.4 % (ref 35.0–45.0)
HEMOGLOBIN: 14.3 g/dL (ref 11.7–15.5)
Lymphs Abs: 1569 cells/uL (ref 850–3900)
MCH: 31.4 pg (ref 27.0–33.0)
MCHC: 35.4 g/dL (ref 32.0–36.0)
MCV: 88.8 fL (ref 80.0–100.0)
MPV: 10.2 fL (ref 7.5–12.5)
Monocytes Relative: 6.4 %
NEUTROS PCT: 57.7 %
Neutro Abs: 2654 cells/uL (ref 1500–7800)
Platelets: 196 10*3/uL (ref 140–400)
RBC: 4.55 10*6/uL (ref 3.80–5.10)
RDW: 12 % (ref 11.0–15.0)
Total Lymphocyte: 34.1 %
WBC: 4.6 10*3/uL (ref 3.8–10.8)
WBCMIX: 294 {cells}/uL (ref 200–950)

## 2018-01-09 LAB — LIPID PANEL
Cholesterol: 167 mg/dL (ref <200)
HDL: 67 mg/dL (ref >50)
LDL CHOLESTEROL (CALC): 78 mg/dL
Non-HDL Cholesterol (Calc): 100 mg/dL (calc) (ref <130)
Total CHOL/HDL Ratio: 2.5 (calc) (ref <5.0)
Triglycerides: 122 mg/dL (ref <150)

## 2018-01-09 LAB — TSH: TSH: 3.49 m[IU]/L (ref 0.40–4.50)

## 2018-01-11 ENCOUNTER — Encounter: Payer: Self-pay | Admitting: Internal Medicine

## 2018-01-11 ENCOUNTER — Ambulatory Visit (INDEPENDENT_AMBULATORY_CARE_PROVIDER_SITE_OTHER): Payer: Federal, State, Local not specified - PPO | Admitting: Internal Medicine

## 2018-01-11 VITALS — BP 110/60 | HR 82 | Ht 66.0 in | Wt 160.0 lb

## 2018-01-11 DIAGNOSIS — M8580 Other specified disorders of bone density and structure, unspecified site: Secondary | ICD-10-CM | POA: Diagnosis not present

## 2018-01-11 DIAGNOSIS — Z882 Allergy status to sulfonamides status: Secondary | ICD-10-CM

## 2018-01-11 DIAGNOSIS — R079 Chest pain, unspecified: Secondary | ICD-10-CM

## 2018-01-11 DIAGNOSIS — Z0000 Encounter for general adult medical examination without abnormal findings: Secondary | ICD-10-CM

## 2018-01-11 DIAGNOSIS — H8109 Meniere's disease, unspecified ear: Secondary | ICD-10-CM

## 2018-01-11 DIAGNOSIS — G4709 Other insomnia: Secondary | ICD-10-CM

## 2018-01-11 DIAGNOSIS — Z853 Personal history of malignant neoplasm of breast: Secondary | ICD-10-CM | POA: Diagnosis not present

## 2018-01-11 DIAGNOSIS — E7849 Other hyperlipidemia: Secondary | ICD-10-CM

## 2018-01-11 LAB — POCT URINALYSIS DIPSTICK
APPEARANCE: NORMAL
Bilirubin, UA: NEGATIVE
Blood, UA: NEGATIVE
GLUCOSE UA: NEGATIVE
Ketones, UA: NEGATIVE
Leukocytes, UA: NEGATIVE
Nitrite, UA: NEGATIVE
ODOR: NORMAL
PH UA: 7.5 (ref 5.0–8.0)
Protein, UA: NEGATIVE
Spec Grav, UA: 1.01 (ref 1.010–1.025)
UROBILINOGEN UA: 0.2 U/dL

## 2018-01-11 NOTE — Progress Notes (Signed)
Subjective:    Patient ID: [name], female    DOB: 1946-03-08, 72 y.o.   MRN: [id number]  HPI 72 year old Female for health maintenance exam and  evaluation of medical issues.  She has a history of hyperlipidemia, history of breast cancer, osteopenia and insomnia.  She has had issues with left serous otitis media on several occasions.  Recently diagnosed with Mnire's disease by ENT physician but is not on any medication.  In February she went to the emergency department with chest pain.  MI was ruled out.  She was advised to follow-up with cardiologist but has not yet done so.  We will make Cardiology referral for her.  She currently is not on Medicare but probably will be next year since her husband is retiring.  She has urinary tract infections from time to time.  One point was on trimethoprim sulfa but developed a swollen lower lip and tingling sensation.  We decided it was an allergic reaction to Sulfa  and labeled her allergic to Sulfa.  At one point prior to that she took trimethoprim sulfa prophylactically after intercourse and has not had any recent urinary tract infections up until late February 2017.  Now she is treated with cephalosporins when she has an infection.  She had bilateral mastectomies in 1993 by Dr. Margot Chimes.  She has had chemotherapy for breast cancer.  History of lumbar disc disease L4-S1 with annular disc bulging and osteophytosis.  A colonoscopy by Dr. Earlean Shawl in 2014.  She had a uterine polyp removed in 2010, fractured toe 2011, left frozen shoulder 1993.  She is intolerant of Macrodantin as it reportedly causes angioedema symptoms with lip swelling also.  She cannot take Avelox either.  This limits the antibiotics that she can take without significant side effects.  She is status post cataract surgery by Dr. great.  She sees Dr. Delman Cheadle for routine eye exams.  History of sinusitis January 2016 treated with Zithromax and Depo-Medrol.  Social history: She  formally worked as a Materials engineer.  Husband is an Systems developer judge for the [employer].  She does not smoke.  Social alcohol consumption.  One daughter.  Family history: Father living with history of MI, hypertension and stroke.  He is in an assisted living facility and is 2 years old.  Mother died at age 5 of a stroke.    Review of Systems  Constitutional: Negative.   All other systems reviewed and are negative.       Objective:   Physical Exam  Constitutional: She is oriented to person, place, and time. She appears well-developed and well-nourished. No distress.  HENT:  Head: Normocephalic and atraumatic.  Right Ear: External ear normal.  Left Ear: External ear normal.  Mouth/Throat: Oropharynx is clear and moist. No oropharyngeal exudate.  Eyes: Pupils are equal, round, and reactive to light. Conjunctivae and EOM are normal. Right eye exhibits no discharge. Left eye exhibits no discharge. No scleral icterus.  Neck: Neck supple. No JVD present. No thyromegaly present.  Cardiovascular: Normal rate, regular rhythm and normal heart sounds. Exam reveals no gallop.  No murmur heard. Pulmonary/Chest: Effort normal and breath sounds normal. No stridor. No respiratory distress. She has no wheezes. She has no rales.  Bilateral mastectomies  Abdominal: Soft. Bowel sounds are normal. She exhibits no distension and no mass. There is no tenderness. There is no rebound and no guarding. No hernia.  Genitourinary:  Genitourinary Comments: Deferred to  GYN  Lymphadenopathy:    She has no cervical adenopathy.  Neurological: She is alert and oriented to person, place, and time. She displays normal reflexes. No cranial nerve deficit. Coordination normal.  Skin: Skin is warm and dry. She is not diaphoretic.  Psychiatric: She has a normal mood and affect. Her behavior is normal. Judgment and thought content normal.  Vitals reviewed.           Assessment & Plan:  Hx Meniere's disease -seen by ENT physician recently  Hyperlipidemia treated with low-dose Lipitor  Family history of heart disease and stroke  Sulfa allergy  History of recurrent urinary infections  Osteopenia  History of insomnia  Multiple drug intolerances including Macrodantin, sulfa and Avelox  Chest pain episode February 2019-refer to Cardiology for further evaluation due to Family history and hyperlipidemia  Otherwise return in 1 year or as needed.

## 2018-01-24 NOTE — Patient Instructions (Signed)
It was a pleasure to see you today.  We will make cardiology referral for you.  Continue same medications and return in 1 year or as needed.

## 2018-02-21 NOTE — Progress Notes (Addendum)
Cardiology Office Note   Date:  02/22/2018   ID:  [name], DOB [birth year], MRN [id number]  PCP:  Elby Showers, MD  Cardiologist:   No primary care provider on file. Referring:  Elby Showers, MD    Chief Complaint  Patient presents with  . Chest Pain      History of Present Illness: [name] is a 72 y.o. female who is referred by Elby Showers, MD for evaluation of chest pain.  The patient has no past cardiac history.  However, in February she woke up with chest discomfort that she said was similar to her previous heartburn.  She drank some water and things seem to get better.  It was a moderate discomfort.  He was in her upper chest.  She would not pay any attention to it but then she had some left arm discomfort when she was getting ready with her hair and make-up.  Because of this she went to the emergency room.  I did review these records.  There was no objective evidence of ischemia.  Enzymes were negative and her EKG was unremarkable.  Since that time she has done well.  She is very active.  She exercises routinely playing tennis and walking in the water and on [place].  With this she denies any cardiovascular symptoms.  She is had no further chest discomfort.  She denies any shortness of breath, PND or orthopnea.  She has no palpitations, presyncope or syncope.  Past Medical History:  Diagnosis Date  . Allergy   . Arthritis   . Breast cancer ([place]) 1993  . Cataract 01/14/2015  . Chronic UTI   . Dyspareunia   . Eustachian tube dysfunction   . Hyperlipidemia   . Osteopenia   . Rhinitis, nonallergic     Past Surgical History:  Procedure Laterality Date  . CATARACT EXTRACTION    . endometrial polyp  2010   Dr. Quincy Simmonds  . MASTECTOMY  1993   bilateral     Current Outpatient Medications  Medication Sig Dispense Refill  . atorvastatin (LIPITOR) 10 MG tablet Take 1 tablet (10 mg total) by mouth daily. 90 tablet 3  . cetirizine (ZYRTEC) 10 MG tablet Take  5 mg by mouth at bedtime.     . Cholecalciferol (VITAMIN D3) 2000 units TABS Take 2,000 Units by mouth daily.    . Cranberry 250 MG TABS Take 250 mg by mouth 2 (two) times daily.     Marland Kitchen estradiol (ESTRACE) 0.1 MG/GM vaginal cream Place 1 Applicatorful vaginally 2 (two) times a week.     . fish oil-omega-3 fatty acids 1000 MG capsule Take 1 g by mouth daily.     . fluticasone (FLONASE) 50 MCG/ACT nasal spray Place 2 sprays into both nostrils daily.    Marland Kitchen LORazepam (ATIVAN) 1 MG tablet Take 1 mg by mouth once a day as needed for anxiety 30 tablet 5  . meclizine (ANTIVERT) 25 MG tablet TAKE 1 TABLET (25 MG TOTAL) BY MOUTH 3 (THREE) TIMES DAILY AS NEEDED FOR DIZZINESS. 30 tablet 0  . montelukast (SINGULAIR) 10 MG tablet Take 10 mg by mouth every evening.  2  . Multiple Vitamin (MULTIVITAMIN) tablet Take 1 tablet by mouth daily.    Marland Kitchen Propylene Glycol (SYSTANE BALANCE) 0.6 % SOLN Place 1-2 drops into both eyes daily as needed (for dryness).      No current facility-administered medications for this visit.     Allergies:  Macrodantin; Nitrofuran derivatives; Sulfa antibiotics; and Avelox [moxifloxacin hcl in nacl]    Social History:  The patient  reports that she quit smoking about 53 years ago. Her smoking use included cigarettes. She has a 1.75 pack-year smoking history. She has never used smokeless tobacco. She reports that she drinks alcohol. She reports that she does not use drugs.   Family History:  The patient's family history includes Breast cancer in her paternal grandmother; Diabetes in her maternal grandfather; Hypertension in her father and mother; Prostate cancer (age of onset: 12) in her father; Stroke in her mother.    ROS:  Please see the history of present illness.   Otherwise, review of systems are positive for none.   All other systems are reviewed and negative.    PHYSICAL EXAM: VS:  BP 116/74 (BP Location: Left Arm, Patient Position: Sitting, Cuff Size: Normal)   Pulse 67    Ht 5\' 6"  (1.676 m)   Wt 160 lb (72.6 kg)   BMI 25.82 kg/m  , BMI Body mass index is 25.82 kg/m. GENERAL:  Well appearing HEENT:  Pupils equal round and reactive, fundi not visualized, oral mucosa unremarkable NECK:  No jugular venous distention, waveform within normal limits, carotid upstroke brisk and symmetric, no bruits, no thyromegaly LYMPHATICS:  No cervical, inguinal adenopathy LUNGS:  Clear to auscultation bilaterally BACK:  No CVA tenderness CHEST:  Status post bilateral mastectomy HEART:  PMI not displaced or sustained,S1 and S2 within normal limits, no S3, no S4, no clicks, no rubs, no murmurs ABD:  Flat, positive bowel sounds normal in frequency in pitch, no bruits, no rebound, no guarding, no midline pulsatile mass, no hepatomegaly, no splenomegaly EXT:  2 plus pulses throughout, no edema, no cyanosis no clubbing SKIN:  No rashes no nodules NEURO:  Cranial nerves II through XII grossly intact, motor grossly intact throughout PSYCH:  Cognitively intact, oriented to person place and time    EKG:  EKG is not ordered today. The ekg ordered 10/06/17 demonstrates sinus rhythm, rate 79, axis within normal limits, intervals within normal limits, no acute ST-T wave changes.   Recent Labs: 01/09/2018: ALT 17; BUN 15; Creat 0.69; Hemoglobin 14.3; Platelets 196; Potassium 4.4; Sodium 136; TSH 3.49    Lipid Panel    Component Value Date/Time   CHOL 167 01/09/2018 0908   TRIG 122 01/09/2018 0908   HDL 67 01/09/2018 0908   CHOLHDL 2.5 01/09/2018 0908   VLDL 18 12/30/2016 1133   LDLCALC 78 01/09/2018 0908      Wt Readings from Last 3 Encounters:  02/22/18 160 lb (72.6 kg)  01/11/18 160 lb (72.6 kg)  10/31/17 160 lb (72.6 kg)      Other studies Reviewed: Additional studies/ records that were reviewed today include: ED records.  . Review of the above records demonstrates:  Please see elsewhere in the note.     ASSESSMENT AND PLAN:  CHEST PAIN: Her chest pain was  atypical.  She has no significant cardiovascular risk factors other than dyslipidemia.  I did review her records.  She has been quite active since then without bringing on any symptoms.  At this point I think that the pretest probability of obstructive coronary disease as an etiology of her complaints is low.  We talked about this at length.  No further testing is planned.  She will continue with primary risk reduction.  We did discuss the fact that should she have any recurrent symptoms and I would consider treadmill  testing and a coronary calcium score.   DYSLIPIDEMIA:  She has this managed by Elby Showers, MD.  No change in therapy is indicated.    Current medicines are reviewed at length with the patient today.  The patient does not have concerns regarding medicines.  The following changes have been made:  no change  Labs/ tests ordered today include:  None No orders of the defined types were placed in this encounter.    Disposition:   FU with me as needed.      Signed, Minus Breeding, MD  02/22/2018 5:17 PM    [place]

## 2018-02-22 ENCOUNTER — Ambulatory Visit: Payer: Federal, State, Local not specified - PPO | Admitting: Cardiology

## 2018-02-22 ENCOUNTER — Encounter: Payer: Self-pay | Admitting: Cardiology

## 2018-02-22 VITALS — BP 116/74 | HR 67 | Ht 66.0 in | Wt 160.0 lb

## 2018-02-22 DIAGNOSIS — R072 Precordial pain: Secondary | ICD-10-CM

## 2018-02-22 NOTE — Patient Instructions (Signed)
Medication Instructions:  Continue current medications  If you need a refill on your cardiac medications before your next appointment, please call your pharmacy.  Labwork: None Ordered   Testing/Procedures: None ordered  Follow-Up: Your physician wants you to follow-up in: As Needed.      Thank you for choosing [facility] at [facility]!!

## 2018-03-20 DIAGNOSIS — Z124 Encounter for screening for malignant neoplasm of cervix: Secondary | ICD-10-CM | POA: Diagnosis not present

## 2018-03-20 DIAGNOSIS — Z6825 Body mass index (BMI) 25.0-25.9, adult: Secondary | ICD-10-CM | POA: Diagnosis not present

## 2018-04-27 ENCOUNTER — Telehealth: Payer: Self-pay | Admitting: Emergency Medicine

## 2018-04-27 MED ORDER — MUPIROCIN 2 % EX OINT: TOPICAL_OINTMENT | CUTANEOUS | 0 refills | Status: DC

## 2018-04-27 NOTE — Telephone Encounter (Signed)
Pt called and stated she is getting sores on the inside of her nose. She is wondering what she can put on them. Please advise thanks.

## 2018-04-27 NOTE — Telephone Encounter (Signed)
She should have Bactroban ointment that has been prescribed before. If not call some in to use bid

## 2018-06-01 DIAGNOSIS — D235 Other benign neoplasm of skin of trunk: Secondary | ICD-10-CM | POA: Diagnosis not present

## 2018-06-01 DIAGNOSIS — L814 Other melanin hyperpigmentation: Secondary | ICD-10-CM | POA: Diagnosis not present

## 2018-06-01 DIAGNOSIS — I8391 Asymptomatic varicose veins of right lower extremity: Secondary | ICD-10-CM | POA: Diagnosis not present

## 2018-06-01 DIAGNOSIS — L821 Other seborrheic keratosis: Secondary | ICD-10-CM | POA: Diagnosis not present

## 2018-06-01 DIAGNOSIS — D1801 Hemangioma of skin and subcutaneous tissue: Secondary | ICD-10-CM | POA: Diagnosis not present

## 2018-06-01 DIAGNOSIS — D2272 Melanocytic nevi of left lower limb, including hip: Secondary | ICD-10-CM | POA: Diagnosis not present

## 2018-06-18 ENCOUNTER — Telehealth: Payer: Self-pay

## 2018-06-18 DIAGNOSIS — M25473 Effusion, unspecified ankle: Secondary | ICD-10-CM

## 2018-06-18 NOTE — Telephone Encounter (Signed)
Patient called states she elevated and iced her leg and the swelling went down and she feels stable at the moment she said she has an appt with ortho on Thursday.

## 2018-06-18 NOTE — Telephone Encounter (Signed)
OK- She called Saturday afternoon saying a nurse had seen her leg at the pedicure place and thought she should have it checked. Her knee had been hurting and she had made appt with ortho for this Thursday. Was worried Saturday about a clot. Not hot or significantly red she said just swollen.wwe agreed she would try Ibuprofen and ice  With elevation and wait it out.

## 2018-06-18 NOTE — Telephone Encounter (Signed)
Left message to call us back with ankle update, per Dr. Renold Genta.

## 2018-06-21 DIAGNOSIS — M25561 Pain in right knee: Secondary | ICD-10-CM | POA: Diagnosis not present

## 2018-07-04 DIAGNOSIS — H903 Sensorineural hearing loss, bilateral: Secondary | ICD-10-CM | POA: Diagnosis not present

## 2018-07-04 DIAGNOSIS — H838X3 Other specified diseases of inner ear, bilateral: Secondary | ICD-10-CM | POA: Diagnosis not present

## 2018-07-04 DIAGNOSIS — H9313 Tinnitus, bilateral: Secondary | ICD-10-CM | POA: Diagnosis not present

## 2018-07-04 DIAGNOSIS — H8101 Meniere's disease, right ear: Secondary | ICD-10-CM | POA: Diagnosis not present

## 2018-07-18 DIAGNOSIS — M25561 Pain in right knee: Secondary | ICD-10-CM | POA: Diagnosis not present

## 2018-07-19 DIAGNOSIS — K219 Gastro-esophageal reflux disease without esophagitis: Secondary | ICD-10-CM | POA: Diagnosis not present

## 2018-07-19 DIAGNOSIS — Z853 Personal history of malignant neoplasm of breast: Secondary | ICD-10-CM | POA: Diagnosis not present

## 2018-09-04 DIAGNOSIS — K219 Gastro-esophageal reflux disease without esophagitis: Secondary | ICD-10-CM | POA: Diagnosis not present

## 2018-09-04 DIAGNOSIS — R21 Rash and other nonspecific skin eruption: Secondary | ICD-10-CM | POA: Diagnosis not present

## 2018-09-04 DIAGNOSIS — J300 Vasomotor rhinitis: Secondary | ICD-10-CM | POA: Diagnosis not present

## 2018-09-10 ENCOUNTER — Other Ambulatory Visit: Payer: Self-pay | Admitting: Internal Medicine

## 2018-11-19 DIAGNOSIS — R21 Rash and other nonspecific skin eruption: Secondary | ICD-10-CM | POA: Diagnosis not present

## 2018-11-19 DIAGNOSIS — K219 Gastro-esophageal reflux disease without esophagitis: Secondary | ICD-10-CM | POA: Diagnosis not present

## 2018-11-19 DIAGNOSIS — R05 Cough: Secondary | ICD-10-CM | POA: Diagnosis not present

## 2018-11-19 DIAGNOSIS — J300 Vasomotor rhinitis: Secondary | ICD-10-CM | POA: Diagnosis not present

## 2019-01-29 ENCOUNTER — Other Ambulatory Visit: Payer: Self-pay

## 2019-01-29 MED ORDER — LORAZEPAM 1 MG PO TABS: ORAL_TABLET | ORAL | 2 refills | Status: DC

## 2019-02-08 ENCOUNTER — Encounter: Payer: Self-pay | Admitting: Internal Medicine

## 2019-02-08 ENCOUNTER — Ambulatory Visit (INDEPENDENT_AMBULATORY_CARE_PROVIDER_SITE_OTHER): Payer: Medicare Other | Admitting: Internal Medicine

## 2019-02-08 ENCOUNTER — Other Ambulatory Visit: Payer: Self-pay | Admitting: Internal Medicine

## 2019-02-08 ENCOUNTER — Telehealth: Payer: Self-pay | Admitting: Internal Medicine

## 2019-02-08 ENCOUNTER — Other Ambulatory Visit: Payer: Self-pay

## 2019-02-08 VITALS — BP 110/70 | HR 72 | Temp 98.2°F | Ht 66.0 in

## 2019-02-08 DIAGNOSIS — S81811A Laceration without foreign body, right lower leg, initial encounter: Secondary | ICD-10-CM | POA: Diagnosis not present

## 2019-02-08 MED ORDER — DOXYCYCLINE HYCLATE 100 MG PO TABS: 100.0000 mg | ORAL_TABLET | Freq: Two times a day (BID) | ORAL | 0 refills | Status: DC

## 2019-02-08 MED ORDER — MUPIROCIN 2 % EX OINT: TOPICAL_OINTMENT | CUTANEOUS | 0 refills | Status: DC

## 2019-02-08 NOTE — Telephone Encounter (Signed)
[name] [phone]  [name] called to say that about a week ago she hit her leg and this morning she took bandage off and it is infected. Spot was about inch wide and now is red around it.

## 2019-02-08 NOTE — Patient Instructions (Signed)
Clean laceration with peroxide twice daily.  Apply Bactroban ointment and keep wound dressed.  Follow-up on Tuesday, June 16.  Tetanus immunization is up-to-date.  Doxycycline 100 mg twice daily for 10 days.

## 2019-02-08 NOTE — Telephone Encounter (Signed)
After talking with Dr Renold Genta she wants Office Visit.

## 2019-02-08 NOTE — Progress Notes (Signed)
   Subjective:    Patient ID: [name], female    DOB: 1945-11-17, 73 y.o.   MRN: [id number]  HPI 73 year old Female [place] the mountains recently and struck her leg inside her residence on a piece of furniture.  This happened about a week ago. At first she thought it was a simple laceration but it became infected and she called today.  She had placed a bandage on it and left the bandage on without removing it.  She was concerned because she saw erythema about the laceration.  Her tetanus immunization is up-to-date.    Review of Systems no fever or chills     Objective:   Physical Exam Approximately 2 inch  wound which is linear on the mid lower leg with some surrounding erythema.  No drainage from the wound.       Assessment & Plan:  Infected right lower leg laceration  Plan: Doxycycline 100 mg twice daily for 10 days.  Tetanus immunization is up-to-date.  Return for follow-up on June 16.  Clean wound with warm soapy water, dry carefully, apply peroxide, apply Bactroban ointment and a light bandage.  Do this twice daily until follow-up.

## 2019-02-12 ENCOUNTER — Encounter: Payer: Self-pay | Admitting: Internal Medicine

## 2019-02-12 ENCOUNTER — Ambulatory Visit (INDEPENDENT_AMBULATORY_CARE_PROVIDER_SITE_OTHER): Payer: Medicare Other | Admitting: Internal Medicine

## 2019-02-12 ENCOUNTER — Other Ambulatory Visit: Payer: Self-pay

## 2019-02-12 VITALS — BP 120/70 | HR 75 | Temp 97.8°F | Ht 66.0 in | Wt 157.0 lb

## 2019-02-12 DIAGNOSIS — S81811A Laceration without foreign body, right lower leg, initial encounter: Secondary | ICD-10-CM

## 2019-02-13 NOTE — Progress Notes (Signed)
   Subjective:    Patient ID: [name], female    DOB: April 23, 1946, 73 y.o.   MRN: [id number]  HPI 73 year old Female in today for follow-up of right leg wound with secondary infection.  Patient was placed on doxycycline 100 mg twice daily for 10 days for secondary bacterial infection of wound at initial visit on June 12.  At that time, she was shown how to take care of the wound using topical peroxide and Bactroban ointment twice daily.  She has been keeping a small bandage over the wound.  She reports that it is much less sore.  It is not draining.  She has no fever or chills.  She injured her leg in the mountains some 10 days to 2 weeks ago.  She struck it on a sharp object accidentally indoors.  She thought it would heal up on its own but it remained tender and had some drainage so she sought medical attention on June 12.    Review of Systems See above.  Reports no intolerance to doxycycline except maybe slight nausea    Objective:   Physical Exam  2 inch wound linear on mid lower leg has closed together nicely.  There is no drainage or surrounding erythema.      Assessment & Plan:  Right lower leg wound with secondary bacterial infection-markedly improved  Plan: Continue local care and finish course of doxycycline.  Return PRN.  Tetanus immunization noted at last visit is up-to-date.

## 2019-02-21 NOTE — Patient Instructions (Signed)
Continue local care to leg wound until healed.  Return as needed.

## 2019-04-16 ENCOUNTER — Other Ambulatory Visit: Payer: Self-pay

## 2019-04-16 ENCOUNTER — Other Ambulatory Visit: Payer: Medicare Other | Admitting: Internal Medicine

## 2019-04-16 DIAGNOSIS — E7849 Other hyperlipidemia: Secondary | ICD-10-CM

## 2019-04-16 DIAGNOSIS — M8580 Other specified disorders of bone density and structure, unspecified site: Secondary | ICD-10-CM | POA: Diagnosis not present

## 2019-04-16 DIAGNOSIS — H8109 Meniere's disease, unspecified ear: Secondary | ICD-10-CM | POA: Diagnosis not present

## 2019-04-16 DIAGNOSIS — Z0000 Encounter for general adult medical examination without abnormal findings: Secondary | ICD-10-CM

## 2019-04-16 DIAGNOSIS — R079 Chest pain, unspecified: Secondary | ICD-10-CM

## 2019-04-16 DIAGNOSIS — G4709 Other insomnia: Secondary | ICD-10-CM | POA: Diagnosis not present

## 2019-04-16 LAB — CBC WITH DIFFERENTIAL/PLATELET
Absolute Monocytes: 333 cells/uL (ref 200–950)
Basophils Absolute: 31 cells/uL (ref 0–200)
Basophils Relative: 0.6 %
Eosinophils Absolute: 52 cells/uL (ref 15–500)
Eosinophils Relative: 1 %
HCT: 43.2 % (ref 35.0–45.0)
Hemoglobin: 14.6 g/dL (ref 11.7–15.5)
Lymphs Abs: 1440 cells/uL (ref 850–3900)
MCH: 30.7 pg (ref 27.0–33.0)
MCHC: 33.8 g/dL (ref 32.0–36.0)
MCV: 90.8 fL (ref 80.0–100.0)
MPV: 9.9 fL (ref 7.5–12.5)
Monocytes Relative: 6.4 %
Neutro Abs: 3344 cells/uL (ref 1500–7800)
Neutrophils Relative %: 64.3 %
Platelets: 191 10*3/uL (ref 140–400)
RBC: 4.76 10*6/uL (ref 3.80–5.10)
RDW: 12 % (ref 11.0–15.0)
Total Lymphocyte: 27.7 %
WBC: 5.2 10*3/uL (ref 3.8–10.8)

## 2019-04-16 LAB — COMPLETE METABOLIC PANEL WITH GFR
AG Ratio: 2.3 (calc) (ref 1.0–2.5)
ALT: 16 U/L (ref 6–29)
AST: 19 U/L (ref 10–35)
Albumin: 4.4 g/dL (ref 3.6–5.1)
Alkaline phosphatase (APISO): 75 U/L (ref 37–153)
BUN: 13 mg/dL (ref 7–25)
CO2: 26 mmol/L (ref 20–32)
Calcium: 9.4 mg/dL (ref 8.6–10.4)
Chloride: 103 mmol/L (ref 98–110)
Creat: 0.65 mg/dL (ref 0.60–0.93)
GFR, Est African American: 104 mL/min/{1.73_m2} (ref >=60)
GFR, Est Non African American: 89 mL/min/{1.73_m2} (ref >=60)
Globulin: 1.9 g/dL (calc) (ref 1.9–3.7)
Glucose, Bld: 92 mg/dL (ref 65–99)
Potassium: 4.5 mmol/L (ref 3.5–5.3)
Sodium: 136 mmol/L (ref 135–146)
Total Bilirubin: 0.5 mg/dL (ref 0.2–1.2)
Total Protein: 6.3 g/dL (ref 6.1–8.1)

## 2019-04-16 LAB — LIPID PANEL
Cholesterol: 174 mg/dL (ref <200)
HDL: 64 mg/dL (ref >=50)
LDL Cholesterol (Calc): 89 mg/dL (calc)
Non-HDL Cholesterol (Calc): 110 mg/dL (calc) (ref <130)
Total CHOL/HDL Ratio: 2.7 (calc) (ref <5.0)
Triglycerides: 109 mg/dL (ref <150)

## 2019-04-16 LAB — TSH: TSH: 4.04 mIU/L (ref 0.40–4.50)

## 2019-04-17 ENCOUNTER — Telehealth: Payer: Self-pay

## 2019-04-17 DIAGNOSIS — Z20828 Contact with and (suspected) exposure to other viral communicable diseases: Secondary | ICD-10-CM | POA: Diagnosis not present

## 2019-04-17 DIAGNOSIS — Z7189 Other specified counseling: Secondary | ICD-10-CM | POA: Diagnosis not present

## 2019-04-17 DIAGNOSIS — J029 Acute pharyngitis, unspecified: Secondary | ICD-10-CM | POA: Diagnosis not present

## 2019-04-17 NOTE — Telephone Encounter (Signed)
Send her to tent for testing. Will need results before can come for CPE. Hopefully will have back before Monday.

## 2019-04-17 NOTE — Telephone Encounter (Signed)
Patient was at her hair dresser on 8/10, patient just found out that the hair dresser was positive for covid19 on 8/15. Patient was here on the 18th for CPE labs, patient is asymptomatic and is wanting to get tested. Okay to schedule virtual visit?

## 2019-04-17 NOTE — Telephone Encounter (Addendum)
Patient has been tested in [place], she will call me back on Monday morning with results of rapid COVID.  Spoke with pt. She had both antibody test and PCR test in [place] today. Wanted to visit granddaughter. Was exposed to hairdresser who tested positive for Covid 19 with minimal if any symptoms on August 3rd. Has nearly been 10 days since this exposure and pt has no symptoms. Expects results in 2-3 days. She has upcoming CPE Monday and will reschedule if PCR test is positive. I initially thought she had rapid test in [place] but she says not. Cancel order for tomorrow at test center on [address] since she was tested today in [place].

## 2019-04-19 ENCOUNTER — Other Ambulatory Visit: Payer: Federal, State, Local not specified - PPO | Admitting: Internal Medicine

## 2019-04-22 ENCOUNTER — Ambulatory Visit (INDEPENDENT_AMBULATORY_CARE_PROVIDER_SITE_OTHER): Payer: Medicare Other | Admitting: Internal Medicine

## 2019-04-22 ENCOUNTER — Encounter: Payer: Self-pay | Admitting: Internal Medicine

## 2019-04-22 ENCOUNTER — Other Ambulatory Visit: Payer: Self-pay

## 2019-04-22 VITALS — BP 120/70 | HR 82 | Temp 97.9°F | Ht 66.0 in | Wt 154.0 lb

## 2019-04-22 DIAGNOSIS — Z20828 Contact with and (suspected) exposure to other viral communicable diseases: Secondary | ICD-10-CM | POA: Diagnosis not present

## 2019-04-22 DIAGNOSIS — M8580 Other specified disorders of bone density and structure, unspecified site: Secondary | ICD-10-CM

## 2019-04-22 DIAGNOSIS — Z853 Personal history of malignant neoplasm of breast: Secondary | ICD-10-CM

## 2019-04-22 DIAGNOSIS — Z20822 Contact with and (suspected) exposure to covid-19: Secondary | ICD-10-CM

## 2019-04-22 DIAGNOSIS — E785 Hyperlipidemia, unspecified: Secondary | ICD-10-CM

## 2019-04-22 DIAGNOSIS — H8109 Meniere's disease, unspecified ear: Secondary | ICD-10-CM | POA: Diagnosis not present

## 2019-04-22 DIAGNOSIS — Z0000 Encounter for general adult medical examination without abnormal findings: Secondary | ICD-10-CM | POA: Diagnosis not present

## 2019-04-22 DIAGNOSIS — Z882 Allergy status to sulfonamides status: Secondary | ICD-10-CM | POA: Diagnosis not present

## 2019-04-22 DIAGNOSIS — G4709 Other insomnia: Secondary | ICD-10-CM

## 2019-04-22 DIAGNOSIS — K219 Gastro-esophageal reflux disease without esophagitis: Secondary | ICD-10-CM | POA: Diagnosis not present

## 2019-04-22 LAB — POCT URINALYSIS DIPSTICK
Appearance: NEGATIVE
Bilirubin, UA: NEGATIVE
Blood, UA: NEGATIVE
Glucose, UA: NEGATIVE
Ketones, UA: NEGATIVE
Leukocytes, UA: NEGATIVE
Nitrite, UA: NEGATIVE
Odor: NEGATIVE
Protein, UA: NEGATIVE
Spec Grav, UA: 1.01 (ref 1.010–1.025)
Urobilinogen, UA: 0.2 E.U./dL
pH, UA: 7 (ref 5.0–8.0)

## 2019-04-22 NOTE — Progress Notes (Signed)
Subjective:    Patient ID: [name], female    DOB: 1946-06-22, 73 y.o.   MRN: [id number]  HPI 72 year old Female  For Welcome to Medicare wellness visit, health maintenance exam, and evaluation of medical issues.  In February 2019 she went to the emergency department with chest pain.  MI was ruled out.  Saw Dr. Percival Spanish in follow-up and was diagnosed with atypical chest pain.  Agreed with statin medication.  She is on Lipitor 10 mg daily and lipid panel is normal.  History of GE reflux treated with over-the-counter Pepcid Bilateral cataract extractions by Dr. Susa Simmonds in 2017.  Has had issues with urinary tract infections but none recently.  At one point was on Trimethoprim Sulfa but developed a swollen lower lip and tingling sensation.  She was labeled allergic to Sulfa.  Now she is treated with Cephalosporins when she has a urinary infection.  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  Uterine polyp removed in 2010.  Fractured toe in 2011.  Left frozen shoulder 1993.  She is intolerant of Macrodantin as it reportedly causes angioedema symptoms with lip swelling.  She cannot take Avelox either.  This limits the antibiotic she can take without significant side effects.  Had colonoscopy with Dr. Earlean Shawl in 2014.  She had bilateral mastectomies in 1993 by Dr. Margot Chimes.  She has had chemotherapy for breast cancer.  History of lumbar disc disease L4-S1 with annular disc bulging and osteophytosis.  History of sinusitis January 2016 treated with Zithromax and Depo-Medrol.  Social history: Husband recently retired as an Systems developer judge for the [employer] administration.  She does not smoke.  Social alcohol consumption.  One daughter.  She formerly worked as a Materials engineer.  Family history: Father with history of MI hypertension and stroke.  Mother died at age 42 of a stroke.   Review  of Systems no new complaints     Objective:   Physical Exam Vitals signs reviewed.  Constitutional:      General: She is not in acute distress.    Appearance: Normal appearance.  HENT:     Head: Normocephalic.     Right Ear: Tympanic membrane normal.     Left Ear: Tympanic membrane normal.     Nose: Nose normal.     Mouth/Throat:     Mouth: Mucous membranes are moist.     Pharynx: Oropharynx is clear.  Eyes:     General: No scleral icterus.       Right eye: No discharge.        Left eye: No discharge.     Extraocular Movements: Extraocular movements intact.     Conjunctiva/sclera: Conjunctivae normal.     Pupils: Pupils are equal, round, and reactive to light.  Neck:     Musculoskeletal: Neck supple. No neck rigidity.     Vascular: No carotid bruit.  Cardiovascular:     Rate and Rhythm: Normal rate and regular rhythm.     Heart sounds: Normal heart sounds. No murmur.  Pulmonary:     Effort: Pulmonary effort is normal.     Breath sounds: Normal breath sounds. No wheezing or rales.  Abdominal:     General: Bowel sounds are normal.     Palpations: Abdomen is soft. There is no mass.     Tenderness: There is no abdominal tenderness. There is no guarding or rebound.  Genitourinary:  Comments: Deferred to GYN Musculoskeletal:     Right lower leg: No edema.     Left lower leg: No edema.  Lymphadenopathy:     Cervical: No cervical adenopathy.  Skin:    General: Skin is warm and dry.  Neurological:     General: No focal deficit present.     Mental Status: She is alert and oriented to person, place, and time.     Cranial Nerves: No cranial nerve deficit.     Sensory: No sensory deficit.     Coordination: Coordination normal.     Gait: Gait normal.  Psychiatric:        Mood and Affect: Mood normal.        Behavior: Behavior normal.        Thought Content: Thought content normal.        Judgment: Judgment normal.           Assessment & Plan:  History of  Mnire's disease  Hyperlipidemia treated with Lipitor  Family history of heart disease and stroke  Sulfa allergy  History of recurrent urinary infections-none recently  Osteopenia  History of insomnia  Status post bilateral cataract surgery  Recent close COVID-19 exposure with negative testing in [place].  This was through her hairdresser but patient was wearing a mask when she saw hairdresser.  GE reflux treated with over-the-counter Pepcid  Remote history of breast cancer treated by Dr. Margot Chimes  Multiple drug intolerances including Macrodantin Sulfa and Avelox  Plan: Return in 1 year or as needed.  Continue current medications.  Recommend flu vaccine.  Has had pneumococcal vaccines and Tdap vaccine.  Subjective:   Patient presents for Medicare Annual/Subsequent preventive examination.  Review Past Medical/Family/Social:see above   Risk Factors  Current exercise habits: plays tennis Dietary issues discussed: low fat low carb  Cardiac risk factors: hyperlipidemia, father with hx MI and stroke. Mother dies with stroke  Depression Screen  (Note: if answer to either of the following is "Yes", a more complete depression screening is indicated)   Over the past two weeks, have you felt down, depressed or hopeless? No  Over the past two weeks, have you felt little interest or pleasure in doing things? No Have you lost interest or pleasure in daily life? No Do you often feel hopeless? No Do you cry easily over simple problems? No   Activities of Daily Living  In your present state of health, do you have any difficulty performing the following activities?:   Driving? No  Managing money? No  Feeding yourself? No  Getting from bed to chair? No  Climbing a flight of stairs? No  Preparing food and eating?: No  Bathing or showering? No  Getting dressed: No  Getting to the toilet? No  Using the toilet:No  Moving around from place to place: No  In the past year have you  fallen or had a near fall?:yes Are you sexually active? yes Do you have more than one partner? No   Hearing Difficulties: No  Do you often ask people to speak up or repeat themselves? No  Do you experience ringing or noises in your ears? yes  Do you have difficulty understanding soft or whispered voices? sometimes Do you feel that you have a problem with memory? No Do you often misplace items? No    Home Safety:  Do you have a smoke alarm at your residence? Yes Do you have grab bars in the bathroom? no Do you have throw rugs  in your house? no   Cognitive Testing  Alert? Yes Normal Appearance?Yes  Oriented to person? Yes Place? Yes  Time? Yes  Recall of three objects? Yes  Can perform simple calculations? Yes  Displays appropriate judgment?Yes  Can read the correct time from a watch face?Yes   List the Names of Other Physician/Practitioners you currently use:  See referral list for the physicians patient is currently seeing.     Review of Systems: see above   Objective:     General appearance: Appears younger than stated age Head: Normocephalic, without obvious abnormality, atraumatic  Eyes: conj clear, EOMi PEERLA  Ears: normal TM's and external ear canals both ears  Nose: Nares normal. Septum midline. Mucosa normal. No drainage or sinus tenderness.  Throat: lips, mucosa, and tongue normal; teeth and gums normal  Neck: no adenopathy, no carotid bruit, no JVD, supple, symmetrical, trachea midline and thyroid not enlarged, symmetric, no tenderness/mass/nodules  No CVA tenderness.  Lungs: clear to auscultation bilaterally  Breasts: normal appearance, no masses or tenderness Heart: regular rate and rhythm, S1, S2 normal, no murmur, click, rub or gallop  Abdomen: soft, non-tender; bowel sounds normal; no masses, no organomegaly  Musculoskeletal: ROM normal in all joints, no crepitus, no deformity, Normal muscle strengthen. Back  is symmetric, no curvature. Skin: Skin  color, texture, turgor normal. No rashes or lesions  Lymph nodes: Cervical, supraclavicular, and axillary nodes normal.  Neurologic: CN 2 -12 Normal, Normal symmetric reflexes. Normal coordination and gait  Psych: Alert & Oriented x 3, Mood appear stable.    Assessment:    Annual wellness medicare exam   Plan:    During the course of the visit the patient was educated and counseled about appropriate screening and preventive services including:   Annual flu vaccine     Patient Instructions (the written plan) was given to the patient.  Medicare Attestation  I have personally reviewed:  The patient's medical and social history  Their use of alcohol, tobacco or illicit drugs  Their current medications and supplements  The patient's functional ability including ADLs,fall risks, home safety risks, cognitive, and hearing and visual impairment  Diet and physical activities  Evidence for depression or mood disorders  The patient's weight, height, BMI, and visual acuity have been recorded in the chart. I have made referrals, counseling, and provided education to the patient based on review of the above and I have provided the patient with a written personalized care plan for preventive services.

## 2019-04-22 NOTE — Telephone Encounter (Signed)
COVID test was negative, so she will be at her appointment today

## 2019-04-22 NOTE — Patient Instructions (Addendum)
It was a pleasure to see you today.  Continue current medications and follow-up in 1 year or as needed. 

## 2019-05-01 ENCOUNTER — Ambulatory Visit: Payer: Medicare Other | Admitting: Internal Medicine

## 2019-05-01 ENCOUNTER — Other Ambulatory Visit: Payer: Self-pay

## 2019-05-07 ENCOUNTER — Encounter: Payer: Self-pay | Admitting: Internal Medicine

## 2019-05-30 DIAGNOSIS — Z01419 Encounter for gynecological examination (general) (routine) without abnormal findings: Secondary | ICD-10-CM | POA: Diagnosis not present

## 2019-05-30 DIAGNOSIS — N390 Urinary tract infection, site not specified: Secondary | ICD-10-CM | POA: Diagnosis not present

## 2019-05-30 DIAGNOSIS — N952 Postmenopausal atrophic vaginitis: Secondary | ICD-10-CM | POA: Diagnosis not present

## 2019-05-30 DIAGNOSIS — Z6824 Body mass index (BMI) 24.0-24.9, adult: Secondary | ICD-10-CM | POA: Diagnosis not present

## 2019-06-04 DIAGNOSIS — N950 Postmenopausal bleeding: Secondary | ICD-10-CM | POA: Diagnosis not present

## 2019-06-04 DIAGNOSIS — R309 Painful micturition, unspecified: Secondary | ICD-10-CM | POA: Diagnosis not present

## 2019-06-04 DIAGNOSIS — R300 Dysuria: Secondary | ICD-10-CM | POA: Diagnosis not present

## 2019-06-18 DIAGNOSIS — H838X3 Other specified diseases of inner ear, bilateral: Secondary | ICD-10-CM | POA: Diagnosis not present

## 2019-06-18 DIAGNOSIS — H8101 Meniere's disease, right ear: Secondary | ICD-10-CM | POA: Diagnosis not present

## 2019-06-18 DIAGNOSIS — H903 Sensorineural hearing loss, bilateral: Secondary | ICD-10-CM | POA: Diagnosis not present

## 2019-07-12 DIAGNOSIS — M1711 Unilateral primary osteoarthritis, right knee: Secondary | ICD-10-CM | POA: Diagnosis not present

## 2019-07-12 DIAGNOSIS — M1712 Unilateral primary osteoarthritis, left knee: Secondary | ICD-10-CM | POA: Diagnosis not present

## 2019-07-12 DIAGNOSIS — M170 Bilateral primary osteoarthritis of knee: Secondary | ICD-10-CM | POA: Diagnosis not present

## 2019-07-12 DIAGNOSIS — M25561 Pain in right knee: Secondary | ICD-10-CM | POA: Diagnosis not present

## 2019-07-24 DIAGNOSIS — D225 Melanocytic nevi of trunk: Secondary | ICD-10-CM | POA: Diagnosis not present

## 2019-07-24 DIAGNOSIS — D1801 Hemangioma of skin and subcutaneous tissue: Secondary | ICD-10-CM | POA: Diagnosis not present

## 2019-07-24 DIAGNOSIS — D485 Neoplasm of uncertain behavior of skin: Secondary | ICD-10-CM | POA: Diagnosis not present

## 2019-07-24 DIAGNOSIS — L821 Other seborrheic keratosis: Secondary | ICD-10-CM | POA: Diagnosis not present

## 2019-07-24 DIAGNOSIS — L814 Other melanin hyperpigmentation: Secondary | ICD-10-CM | POA: Diagnosis not present

## 2019-08-02 ENCOUNTER — Other Ambulatory Visit: Payer: Self-pay | Admitting: Internal Medicine

## 2019-08-02 MED ORDER — LORAZEPAM 1 MG PO TABS: ORAL_TABLET | ORAL | 3 refills | Status: DC

## 2019-08-02 NOTE — Telephone Encounter (Signed)
Received Fax RX request from  [facility] [id number] [address], [place] [phone] (Phone) [phone] (Fax)     Medication - LORazepam (ATIVAN) 1 MG tablet    Last Refill - 06/09/19  Last OV - 04/22/19  Last CPE - 04/22/19  Next Appointment -

## 2019-09-05 DIAGNOSIS — L988 Other specified disorders of the skin and subcutaneous tissue: Secondary | ICD-10-CM | POA: Diagnosis not present

## 2019-09-12 DIAGNOSIS — M1711 Unilateral primary osteoarthritis, right knee: Secondary | ICD-10-CM | POA: Diagnosis not present

## 2019-09-12 DIAGNOSIS — M25561 Pain in right knee: Secondary | ICD-10-CM | POA: Diagnosis not present

## 2019-09-19 DIAGNOSIS — M25561 Pain in right knee: Secondary | ICD-10-CM | POA: Diagnosis not present

## 2019-09-19 DIAGNOSIS — M1711 Unilateral primary osteoarthritis, right knee: Secondary | ICD-10-CM | POA: Diagnosis not present

## 2019-09-26 DIAGNOSIS — M1711 Unilateral primary osteoarthritis, right knee: Secondary | ICD-10-CM | POA: Diagnosis not present

## 2019-09-27 DIAGNOSIS — Z23 Encounter for immunization: Secondary | ICD-10-CM | POA: Diagnosis not present

## 2019-09-29 ENCOUNTER — Ambulatory Visit: Payer: Medicare Other

## 2019-10-04 ENCOUNTER — Ambulatory Visit: Payer: Medicare Other

## 2019-10-25 DIAGNOSIS — Z23 Encounter for immunization: Secondary | ICD-10-CM | POA: Diagnosis not present

## 2019-11-13 DIAGNOSIS — R21 Rash and other nonspecific skin eruption: Secondary | ICD-10-CM | POA: Diagnosis not present

## 2019-11-13 DIAGNOSIS — J300 Vasomotor rhinitis: Secondary | ICD-10-CM | POA: Diagnosis not present

## 2019-11-13 DIAGNOSIS — K219 Gastro-esophageal reflux disease without esophagitis: Secondary | ICD-10-CM | POA: Diagnosis not present

## 2019-11-13 DIAGNOSIS — R05 Cough: Secondary | ICD-10-CM | POA: Diagnosis not present

## 2020-02-10 ENCOUNTER — Other Ambulatory Visit: Payer: Self-pay

## 2020-02-10 MED ORDER — ATORVASTATIN CALCIUM 10 MG PO TABS: 10.0000 mg | ORAL_TABLET | Freq: Every day | ORAL | 0 refills | Status: DC

## 2020-02-11 DIAGNOSIS — Z853 Personal history of malignant neoplasm of breast: Secondary | ICD-10-CM | POA: Diagnosis not present

## 2020-02-11 DIAGNOSIS — K219 Gastro-esophageal reflux disease without esophagitis: Secondary | ICD-10-CM | POA: Diagnosis not present

## 2020-02-11 DIAGNOSIS — Z1211 Encounter for screening for malignant neoplasm of colon: Secondary | ICD-10-CM | POA: Diagnosis not present

## 2020-03-12 ENCOUNTER — Telehealth: Payer: Self-pay | Admitting: Internal Medicine

## 2020-03-12 DIAGNOSIS — Z20822 Contact with and (suspected) exposure to covid-19: Secondary | ICD-10-CM | POA: Diagnosis not present

## 2020-03-12 DIAGNOSIS — Z03818 Encounter for observation for suspected exposure to other biological agents ruled out: Secondary | ICD-10-CM | POA: Diagnosis not present

## 2020-03-12 NOTE — Telephone Encounter (Signed)
9 pm : Patient called office and I answered the phone. She had onset of dysuria about an hour ago. Has Keflex on hand but expired 2019. Is worried symptoms will worsen overnight.Is intolerant of Macrodantin,Sulfa, Avelox.  Office is closed this evening. Suggest she take no pyridium and just one dose of Keflex and come at 9 am tomorrow morning for nurse visit, dipstick U/A and culture.

## 2020-03-13 ENCOUNTER — Ambulatory Visit (INDEPENDENT_AMBULATORY_CARE_PROVIDER_SITE_OTHER): Payer: Medicare Other | Admitting: Internal Medicine

## 2020-03-13 ENCOUNTER — Encounter: Payer: Self-pay | Admitting: Internal Medicine

## 2020-03-13 ENCOUNTER — Other Ambulatory Visit: Payer: Self-pay

## 2020-03-13 VITALS — BP 110/60 | HR 80 | Temp 98.7°F | Ht 66.0 in | Wt 154.0 lb

## 2020-03-13 DIAGNOSIS — R300 Dysuria: Secondary | ICD-10-CM

## 2020-03-13 MED ORDER — CEPHALEXIN 250 MG PO CAPS: 250.0000 mg | ORAL_CAPSULE | Freq: Four times a day (QID) | ORAL | 0 refills | Status: DC

## 2020-03-13 NOTE — Patient Instructions (Addendum)
Took pyridium around 1:00am. Cannot do urine dipstick. Keflex 250 mg 4 times a day x 7 days. Culture sent.

## 2020-03-13 NOTE — Progress Notes (Signed)
   Subjective:    Patient ID: [name], female    DOB: Jul 22, 1946, 74 y.o.   MRN: [id number]  HPI  Presents for dysuria after calling last night with acute onset of dysuria around 8 pm.. Remote history of UTIs but none recently. Had some leftover Keflex. Advised that she could take one tablet and be seen this am.  Apparently took Pyridium around midnight for discomfort and spasm. Therefore, we cannot do urine dipstick this am as it will not be accurate. Urine sent for culture. Have sent in electronically Rx for Keflex 250 mg 4 times daily x 7days pending culture.   Review of Systems no vomiting     Objective:   Physical Exam  Afebrile. BP 110/60 weight 154 pounds.      Assessment & Plan:  Dysuria and bladder spasm- assume acute UTI or urethritis  Plan: Keflex 250 mg 4 times daily x 7 days with no refill pending culture. Further instructions depend on culture results.

## 2020-03-14 LAB — URINE CULTURE
MICRO NUMBER:: 10714887
Result:: NO GROWTH
SPECIMEN QUALITY:: ADEQUATE

## 2020-03-17 ENCOUNTER — Telehealth: Payer: Self-pay | Admitting: Internal Medicine

## 2020-03-17 NOTE — Telephone Encounter (Signed)
[name] called to see if Urine culture is back yet.

## 2020-03-20 DIAGNOSIS — N342 Other urethritis: Secondary | ICD-10-CM | POA: Diagnosis not present

## 2020-03-20 DIAGNOSIS — R309 Painful micturition, unspecified: Secondary | ICD-10-CM | POA: Diagnosis not present

## 2020-04-21 ENCOUNTER — Other Ambulatory Visit: Payer: Medicare Other | Admitting: Internal Medicine

## 2020-04-21 ENCOUNTER — Other Ambulatory Visit: Payer: Self-pay

## 2020-04-21 DIAGNOSIS — G4709 Other insomnia: Secondary | ICD-10-CM

## 2020-04-21 DIAGNOSIS — Z1329 Encounter for screening for other suspected endocrine disorder: Secondary | ICD-10-CM | POA: Diagnosis not present

## 2020-04-21 DIAGNOSIS — Z0000 Encounter for general adult medical examination without abnormal findings: Secondary | ICD-10-CM | POA: Diagnosis not present

## 2020-04-21 DIAGNOSIS — M8580 Other specified disorders of bone density and structure, unspecified site: Secondary | ICD-10-CM | POA: Diagnosis not present

## 2020-04-21 DIAGNOSIS — E785 Hyperlipidemia, unspecified: Secondary | ICD-10-CM | POA: Diagnosis not present

## 2020-04-21 DIAGNOSIS — Z853 Personal history of malignant neoplasm of breast: Secondary | ICD-10-CM | POA: Diagnosis not present

## 2020-04-21 DIAGNOSIS — M1711 Unilateral primary osteoarthritis, right knee: Secondary | ICD-10-CM | POA: Diagnosis not present

## 2020-04-21 DIAGNOSIS — H8109 Meniere's disease, unspecified ear: Secondary | ICD-10-CM | POA: Diagnosis not present

## 2020-04-21 DIAGNOSIS — K219 Gastro-esophageal reflux disease without esophagitis: Secondary | ICD-10-CM | POA: Diagnosis not present

## 2020-04-22 LAB — COMPLETE METABOLIC PANEL WITH GFR
AG Ratio: 2.5 (calc) (ref 1.0–2.5)
ALT: 19 U/L (ref 6–29)
AST: 21 U/L (ref 10–35)
Albumin: 4.3 g/dL (ref 3.6–5.1)
Alkaline phosphatase (APISO): 80 U/L (ref 37–153)
BUN: 12 mg/dL (ref 7–25)
CO2: 24 mmol/L (ref 20–32)
Calcium: 9 mg/dL (ref 8.6–10.4)
Chloride: 103 mmol/L (ref 98–110)
Creat: 0.6 mg/dL (ref 0.60–0.93)
GFR, Est African American: 106 mL/min/{1.73_m2} (ref >=60)
GFR, Est Non African American: 91 mL/min/{1.73_m2} (ref >=60)
Globulin: 1.7 g/dL (calc) — ABNORMAL LOW (ref 1.9–3.7)
Glucose, Bld: 88 mg/dL (ref 65–99)
Potassium: 4.2 mmol/L (ref 3.5–5.3)
Sodium: 138 mmol/L (ref 135–146)
Total Bilirubin: 0.4 mg/dL (ref 0.2–1.2)
Total Protein: 6 g/dL — ABNORMAL LOW (ref 6.1–8.1)

## 2020-04-22 LAB — LIPID PANEL
Cholesterol: 158 mg/dL (ref <200)
HDL: 58 mg/dL (ref >=50)
LDL Cholesterol (Calc): 81 mg/dL (calc)
Non-HDL Cholesterol (Calc): 100 mg/dL (calc) (ref <130)
Total CHOL/HDL Ratio: 2.7 (calc) (ref <5.0)
Triglycerides: 99 mg/dL (ref <150)

## 2020-04-22 LAB — CBC WITH DIFFERENTIAL/PLATELET
Absolute Monocytes: 378 cells/uL (ref 200–950)
Basophils Absolute: 50 cells/uL (ref 0–200)
Basophils Relative: 1.2 %
Eosinophils Absolute: 109 cells/uL (ref 15–500)
Eosinophils Relative: 2.6 %
HCT: 41.5 % (ref 35.0–45.0)
Hemoglobin: 14.3 g/dL (ref 11.7–15.5)
Lymphs Abs: 1273 cells/uL (ref 850–3900)
MCH: 31.2 pg (ref 27.0–33.0)
MCHC: 34.5 g/dL (ref 32.0–36.0)
MCV: 90.6 fL (ref 80.0–100.0)
MPV: 9.8 fL (ref 7.5–12.5)
Monocytes Relative: 9 %
Neutro Abs: 2390 cells/uL (ref 1500–7800)
Neutrophils Relative %: 56.9 %
Platelets: 212 10*3/uL (ref 140–400)
RBC: 4.58 10*6/uL (ref 3.80–5.10)
RDW: 12 % (ref 11.0–15.0)
Total Lymphocyte: 30.3 %
WBC: 4.2 10*3/uL (ref 3.8–10.8)

## 2020-04-22 LAB — TSH: TSH: 4 mIU/L (ref 0.40–4.50)

## 2020-04-24 ENCOUNTER — Ambulatory Visit (INDEPENDENT_AMBULATORY_CARE_PROVIDER_SITE_OTHER): Payer: Medicare Other | Admitting: Internal Medicine

## 2020-04-24 ENCOUNTER — Encounter: Payer: Self-pay | Admitting: Internal Medicine

## 2020-04-24 ENCOUNTER — Other Ambulatory Visit: Payer: Self-pay

## 2020-04-24 VITALS — BP 110/80 | HR 75 | Ht 65.5 in | Wt 153.0 lb

## 2020-04-24 DIAGNOSIS — E785 Hyperlipidemia, unspecified: Secondary | ICD-10-CM

## 2020-04-24 DIAGNOSIS — G4709 Other insomnia: Secondary | ICD-10-CM | POA: Diagnosis not present

## 2020-04-24 DIAGNOSIS — Z882 Allergy status to sulfonamides status: Secondary | ICD-10-CM | POA: Diagnosis not present

## 2020-04-24 DIAGNOSIS — Z0000 Encounter for general adult medical examination without abnormal findings: Secondary | ICD-10-CM | POA: Diagnosis not present

## 2020-04-24 DIAGNOSIS — Z853 Personal history of malignant neoplasm of breast: Secondary | ICD-10-CM | POA: Diagnosis not present

## 2020-04-24 DIAGNOSIS — H8109 Meniere's disease, unspecified ear: Secondary | ICD-10-CM

## 2020-04-24 DIAGNOSIS — M8580 Other specified disorders of bone density and structure, unspecified site: Secondary | ICD-10-CM | POA: Diagnosis not present

## 2020-04-24 LAB — POCT URINALYSIS DIPSTICK
Appearance: NEGATIVE
Bilirubin, UA: NEGATIVE
Blood, UA: NEGATIVE
Glucose, UA: NEGATIVE
Ketones, UA: NEGATIVE
Leukocytes, UA: NEGATIVE
Nitrite, UA: NEGATIVE
Odor: NEGATIVE
Protein, UA: NEGATIVE
Spec Grav, UA: 1.01 (ref 1.010–1.025)
Urobilinogen, UA: 0.2 E.U./dL
pH, UA: 6.5 (ref 5.0–8.0)

## 2020-05-06 ENCOUNTER — Other Ambulatory Visit: Payer: Self-pay | Admitting: Internal Medicine

## 2020-05-06 DIAGNOSIS — Z961 Presence of intraocular lens: Secondary | ICD-10-CM | POA: Diagnosis not present

## 2020-05-17 NOTE — Patient Instructions (Addendum)
It was a pleasure to see you today.  Labs are within normal limits.  Have annual flu vaccine and Covid booster when available.  Continue lipid-lowering medication.  Watch diet and continue to exercise.

## 2020-05-17 NOTE — Progress Notes (Signed)
Subjective:    Patient ID: [name], female    DOB: 1946/06/26, 74 y.o.   MRN: [id number]  HPI 74 year old Female seen for Medicare wellness first annual, health maintenance exam and evaluation of medical issues.  In February 2019 she went to the emergency department with chest pain and MI was ruled out.  She saw Dr. Percival Spanish for follow-up and was diagnosed with atypical chest pain.  He agreed that she remain on statin medication.  She is on Lipitor 10 mg daily.  History of GE reflux treated with over-the-counter Pepcid.  Bilateral cataract extractions  in 2017.  History of pseudophakia both eyes status post laser treatment June 2017, August 2017 and April 2018.  History of recurrent urinary infections but none in some time up until July 2016.  She called with dysuria.  However she had taken some leftover antibiotic at home before giving the specimen and culture revealed no growth.  She is intolerant to many antibiotics including Macrodantin, sulfa, Avelox.  She can take Keflex.  She was prescribed Keflex 250 mg 4 times a day for 7 days at nurse visit on July 16.  She has been diagnosed with Mnire's disease by ENT physician but does not take any chronic medication for it.  Had colonoscopy by Dr. Earlean Shawl in 2014.  Uterine polyp removed in 2010.  Fractured toe in 2011.  Left frozen shoulder 1993.  She had bilateral mastectomies in 1993 by Dr. Margot Chimes.  She has had chemotherapy for breast cancer.  History of lumbar disc disease L4-S1 with annular disc bulging and osteophytosis.  History of sinusitis January 2016 treated with Zithromax and Depo-Medrol.  Social history: Husband has retired as an Education officer, museum for [employer].  She does not smoke.  Social alcohol consumption.  1 daughter.  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  Family history: Father with history of MI, hypertension and stroke.  Mother died at age 57 of a  stroke    Review of Systems  Respiratory: Negative.   Cardiovascular: Negative.   Gastrointestinal: Negative.   Neurological: Negative.   Psychiatric/Behavioral: Negative.        Objective:   Physical Exam Blood pressure 110/80 pulse 75 temperature 98 degrees orally weight 153 pounds height 5 feet 5.5 inches BMI 25.07  Skin warm and dry.  Nodes none.  TMs are clear.  Neck is supple without JVD thyromegaly or carotid bruits.  Chest clear to auscultation.  Breast without masses.  Cardiac exam regular rate and rhythm normal S1 and S2.  Abdomen soft nondistended without hepatosplenomegaly masses or tenderness.  GYN exam deferred to gynecologist.  No lower extremity pitting edema.  No focal deficits on brief neurological exam.  Affect thought and judgment appear to be normal.       Assessment & Plan:  Normal health maintenance exam  History of hyperlipidemia treated with low-dose Lipitor 10 mg daily and lipid panel as well as liver functions are normal.  History of occasional urinary tract infections but has multidrug intolerances- last symptoms were July 2021  History of Mnire's disease  Family history of heart disease and stroke  Sulfa allergy  Osteopenia  History of insomnia treated with as needed lorazepam  Status post bilateral cataract surgery  History of GE reflux treated with over-the-counter Pepcid  Remote history of breast cancer treated by Dr. Jennette Bill  Plan: Seems to be doing well overall.  Immunizations are up-to-date.  Have annual flu vaccine and Covid booster.  Return in 1 year or as needed  Subjective:   Patient presents for Medicare Annual/Subsequent preventive examination.  Review Past Medical/Family/Social:   Risk Factors  Current exercise habits: Watches her weight and exercises Dietary issues discussed: Low-fat low carbohydrate  Cardiac risk factors: Hyperlipidemia  Depression Screen  (Note: if answer to either of the following is "Yes", a  more complete depression screening is indicated)   Over the past two weeks, have you felt down, depressed or hopeless? -Just over the pandemic a bit Over the past two weeks, have you felt little interest or pleasure in doing things? No Have you lost interest or pleasure in daily life? No Do you often feel hopeless? No Do you cry easily over simple problems? No   Activities of Daily Living  In your present state of health, do you have any difficulty performing the following activities?:   Driving? No  Managing money? No  Feeding yourself? No  Getting from bed to chair? No  Climbing a flight of stairs? No  Preparing food and eating?: No  Bathing or showering? No  Getting dressed: No  Getting to the toilet? No  Using the toilet:No  Moving around from place to place: No  In the past year have you fallen or had a near fall?:No  Are you sexually active?  Sometimes Do you have more than one partner? No   Hearing Difficulties:  Do you often ask people to speak up or repeat themselves?  Sometimes Do you experience ringing or noises in your ears?  Sometimes Do you have difficulty understanding soft or whispered voices? No  Do you feel that you have a problem with memory? No Do you often misplace items?  Sometimes   Home Safety:  Do you have a smoke alarm at your residence? Yes Do you have grab bars in the bathroom?  No Do you have throw rugs in your house?  None   Cognitive Testing  Alert? Yes Normal Appearance?Yes  Oriented to person? Yes Place? Yes  Time? Yes  Recall of three objects? Yes  Can perform simple calculations? Yes  Displays appropriate judgment?Yes  Can read the correct time from a watch face?Yes   List the Names of Other Physician/Practitioners you currently use:  See referral list for the physicians patient is currently seeing.     Review of Systems: See above   Objective:     General appearance: Appears younger than stated age Head: Normocephalic,  without obvious abnormality, atraumatic  Eyes: conj clear, EOMi PEERLA  Ears: normal TM's and external ear canals both ears  Nose: Nares normal. Septum midline. Mucosa normal. No drainage or sinus tenderness.  Throat: lips, mucosa, and tongue normal; teeth and gums normal  Neck: no adenopathy, no carotid bruit, no JVD, supple, symmetrical, trachea midline and thyroid not enlarged, symmetric, no tenderness/mass/nodules  No CVA tenderness.  Lungs: clear to auscultation bilaterally  Breasts: normal appearance, no masses or tenderness Heart: regular rate and rhythm, S1, S2 normal, no murmur, click, rub or gallop  Abdomen: soft, non-tender; bowel sounds normal; no masses, no organomegaly  Musculoskeletal: ROM normal in all joints, no crepitus, no deformity, Normal muscle strengthen. Back  is symmetric, no curvature. Skin: Skin color, texture, turgor normal. No rashes or lesions  Lymph nodes: Cervical, supraclavicular, and axillary nodes normal.  Neurologic: CN 2 -12 Normal, Normal symmetric reflexes. Normal coordination and gait  Psych: Alert & Oriented x 3, Mood appear stable.  Assessment:    Annual wellness medicare exam   Plan:    During the course of the visit the patient was educated and counseled about appropriate screening and preventive services including:   Flu vaccine     Patient Instructions (the written plan) was given to the patient.  Medicare Attestation  I have personally reviewed:  The patient's medical and social history  Their use of alcohol, tobacco or illicit drugs  Their current medications and supplements  The patient's functional ability including ADLs,fall risks, home safety risks, cognitive, and hearing and visual impairment  Diet and physical activities  Evidence for depression or mood disorders  The patient's weight, height, BMI, and visual acuity have been recorded in the chart. I have made referrals, counseling, and provided education to the patient  based on review of the above and I have provided the patient with a written personalized care plan for preventive services.

## 2020-06-01 ENCOUNTER — Other Ambulatory Visit: Payer: Self-pay | Admitting: Obstetrics and Gynecology

## 2020-06-01 DIAGNOSIS — I890 Lymphedema, not elsewhere classified: Secondary | ICD-10-CM

## 2020-06-01 DIAGNOSIS — Z779 Other contact with and (suspected) exposures hazardous to health: Secondary | ICD-10-CM | POA: Diagnosis not present

## 2020-06-01 DIAGNOSIS — Z853 Personal history of malignant neoplasm of breast: Secondary | ICD-10-CM | POA: Diagnosis not present

## 2020-06-01 DIAGNOSIS — Z124 Encounter for screening for malignant neoplasm of cervix: Secondary | ICD-10-CM | POA: Diagnosis not present

## 2020-06-01 DIAGNOSIS — Z6825 Body mass index (BMI) 25.0-25.9, adult: Secondary | ICD-10-CM | POA: Diagnosis not present

## 2020-06-03 ENCOUNTER — Other Ambulatory Visit: Payer: Self-pay | Admitting: Obstetrics and Gynecology

## 2020-06-03 ENCOUNTER — Ambulatory Visit
Admission: RE | Admit: 2020-06-03 | Discharge: 2020-06-03 | Disposition: A | Discharge status: Home / Self Care | Payer: Medicare Other | Source: Ambulatory Visit | Attending: Obstetrics and Gynecology | Admitting: Obstetrics and Gynecology

## 2020-06-03 ENCOUNTER — Other Ambulatory Visit: Payer: Self-pay

## 2020-06-03 DIAGNOSIS — I890 Lymphedema, not elsewhere classified: Secondary | ICD-10-CM

## 2020-06-03 DIAGNOSIS — N6489 Other specified disorders of breast: Secondary | ICD-10-CM | POA: Diagnosis not present

## 2020-06-03 DIAGNOSIS — Z853 Personal history of malignant neoplasm of breast: Secondary | ICD-10-CM

## 2020-06-11 ENCOUNTER — Telehealth: Payer: Self-pay | Admitting: Internal Medicine

## 2020-06-11 NOTE — Telephone Encounter (Addendum)
Called patient back to schedule appointment and to get little more information. She would like to get your ideas what could be causing this. Plus get a referral to Lymphedema Clinic at [facility]. She did talk with GYN, but would like to get your take on it.

## 2020-06-11 NOTE — Telephone Encounter (Signed)
[name] [phone]  [name] called to say she has place under left arm. It was there when she had her annual GYN visit so they ordered an Breast US, that did not show anything. So she would like to come in and talk with you about what her next steps would be since she has history of Lymphedma

## 2020-06-11 NOTE — Telephone Encounter (Signed)
I am not sure what there is to do about this. Did she discuss with GYN?

## 2020-06-11 NOTE — Telephone Encounter (Signed)
Wednesday next week

## 2020-06-15 ENCOUNTER — Other Ambulatory Visit: Payer: Self-pay

## 2020-06-15 ENCOUNTER — Ambulatory Visit (INDEPENDENT_AMBULATORY_CARE_PROVIDER_SITE_OTHER): Payer: Medicare Other | Admitting: Internal Medicine

## 2020-06-15 ENCOUNTER — Ambulatory Visit
Admission: RE | Admit: 2020-06-15 | Discharge: 2020-06-15 | Disposition: A | Discharge status: Home / Self Care | Payer: Medicare Other | Source: Ambulatory Visit | Attending: Internal Medicine | Admitting: Internal Medicine

## 2020-06-15 ENCOUNTER — Encounter: Payer: Self-pay | Admitting: Internal Medicine

## 2020-06-15 VITALS — BP 120/80 | HR 74 | Ht 65.5 in | Wt 153.0 lb

## 2020-06-15 DIAGNOSIS — F411 Generalized anxiety disorder: Secondary | ICD-10-CM

## 2020-06-15 DIAGNOSIS — M7989 Other specified soft tissue disorders: Secondary | ICD-10-CM | POA: Diagnosis not present

## 2020-06-15 DIAGNOSIS — R2232 Localized swelling, mass and lump, left upper limb: Secondary | ICD-10-CM

## 2020-06-15 DIAGNOSIS — Z853 Personal history of malignant neoplasm of breast: Secondary | ICD-10-CM | POA: Diagnosis not present

## 2020-06-15 NOTE — Progress Notes (Signed)
   Subjective:    Patient ID: [name], female    DOB: [birth year], 74 y.o.   MRN: [id number]  HPI 74 year old Female seen today for possible lymphedema left upper extremity.  She had Medicare wellness visit in August here.  She had bilateral mastectomies in 1993 by Dr. Margot Chimes.  She has had chemotherapy for breast cancer.  She reported her concerns of swelling in her left axilla to her gynecologist and was sent for an ultrasound of the left axilla.  There was questionable fullness in the left axilla is noted on radiology report but no enlarged adenopathy was identified.  Patient has noticed some prominence along musculature of her left dorsal forearm but no distinct swelling of the entire left upper extremity.  She had mentioned getting referred to a lymphedema clinic.  I think it is unusual for her to have lymphedema this many years after her mastectomy.  She, of course, is worried and anxious about this.   Review of Systems see above     Objective:   Physical Exam Blood pressure 120/80 pulse 74 regular pulse oximetry 96% weight 153 pounds  Examination of the left axilla reveals no palpable masses.  There is a prominence of the dorsal aspect of her left forearm but this does not extend to her upper arm or to her wrist.  It is not any pitting edema whatsoever and just seems to be a firmness of the musculature of her forearm.  It is very minimal.  Her fingers are not swollen.       Assessment & Plan:  I do not think she has lymphedema.  I have suggested she have a chest x-ray to see if there is any underlying pathology in her chest.  She agrees to this.  Total time spent with patient and reviewing her records is 20 minutes.

## 2020-06-15 NOTE — Patient Instructions (Signed)
Please have chest x-ray for follow-up of this issue.  I do not think you have lymphedema.  I do not palpate any axillary adenopathy.

## 2020-06-22 DIAGNOSIS — Z23 Encounter for immunization: Secondary | ICD-10-CM | POA: Diagnosis not present

## 2020-06-27 ENCOUNTER — Encounter: Payer: Self-pay | Admitting: Internal Medicine

## 2020-06-27 ENCOUNTER — Telehealth: Payer: Self-pay | Admitting: Internal Medicine

## 2020-06-27 MED ORDER — CEPHALEXIN 250 MG PO CAPS: 250.0000 mg | ORAL_CAPSULE | Freq: Four times a day (QID) | ORAL | 1 refills | Status: DC

## 2020-06-27 NOTE — Telephone Encounter (Signed)
She called around 3:15 pm complaining of dysuria. Has a device from [facility] where she can check her urine and device tells her she may have a UTI. She only has a few Keflex tabs left. She is wondering what to do since it is a weekend. She has a 3 hour dental appt on Monday morning. May follow up here on Monday at 4 pm. Go ahead and start Keflex 250 mg 4 times daily. Do not take Pyridium after today.

## 2020-06-29 ENCOUNTER — Other Ambulatory Visit: Payer: Self-pay

## 2020-06-29 ENCOUNTER — Ambulatory Visit (INDEPENDENT_AMBULATORY_CARE_PROVIDER_SITE_OTHER): Payer: Medicare Other | Admitting: Internal Medicine

## 2020-06-29 ENCOUNTER — Encounter: Payer: Self-pay | Admitting: Internal Medicine

## 2020-06-29 VITALS — Ht 65.5 in | Wt 156.0 lb

## 2020-06-29 DIAGNOSIS — R300 Dysuria: Secondary | ICD-10-CM

## 2020-06-29 LAB — POCT URINALYSIS DIPSTICK
Appearance: NEGATIVE
Bilirubin, UA: NEGATIVE
Blood, UA: NEGATIVE
Glucose, UA: NEGATIVE
Ketones, UA: NEGATIVE
Leukocytes, UA: NEGATIVE
Nitrite, UA: NEGATIVE
Odor: NEGATIVE
Protein, UA: NEGATIVE
Spec Grav, UA: 1.01 (ref 1.010–1.025)
Urobilinogen, UA: 0.2 E.U./dL
pH, UA: 6.5 (ref 5.0–8.0)

## 2020-07-01 ENCOUNTER — Telehealth: Payer: Self-pay

## 2020-07-01 NOTE — Telephone Encounter (Signed)
Patient called she has frequent urination, she said she feels like she is not thinking clearly. She said she has a urine home machine at home she did a test and it shows leukocytes. She wants to know if she needs to call her GYN and get an appointment with them. She said she is taking the [place] 4 times a day.

## 2020-07-02 ENCOUNTER — Telehealth: Payer: Self-pay | Admitting: Internal Medicine

## 2020-07-02 ENCOUNTER — Encounter: Payer: Self-pay | Admitting: Internal Medicine

## 2020-07-02 DIAGNOSIS — N952 Postmenopausal atrophic vaginitis: Secondary | ICD-10-CM | POA: Diagnosis not present

## 2020-07-02 DIAGNOSIS — R350 Frequency of micturition: Secondary | ICD-10-CM | POA: Diagnosis not present

## 2020-07-02 NOTE — Telephone Encounter (Signed)
Patient called back this morning and said she was able to get in with her GYN and get the issue resolved.

## 2020-07-02 NOTE — Telephone Encounter (Signed)
She did a urinalysis and it was clear but she sent it out for a culture. She thinks this might be external irritation, she was prescribed a cream and was instructed to get some coconut oil and to change her toilet paper.

## 2020-07-02 NOTE — Telephone Encounter (Signed)
Please find out what the GYN told her so we can make a note in the chart.

## 2020-07-02 NOTE — Telephone Encounter (Signed)
Patient had recurrent UTI symptoms.  Was on Keflex.  Did probably not a clean-catch urine specimen at home showing white cells.  She went to her GYN physician who diagnosed a bacterial vaginosis and she is now on Flagyl.  Does not want to keep taking Keflex.  Says urine specimen at GYN was normal.  We have agreed she will stop Keflex at this point in time and continue to be treated for bacterial vaginosis with Flagyl.  She has a device at home that she can screen for urinary tract infections but now says that she probably did not get clean-catch specimen when she ran the specimens at home recently.  She wanted to call and clarify what it happened.  We agreed that she will stop Keflex and continue Flagyl per GYN.

## 2020-07-12 NOTE — Progress Notes (Signed)
   Subjective:    Patient ID: [name], female    DOB: February 16, 1946, 74 y.o.   MRN: [id number]  HPI Patient called on Saturday, October 30 complaining of dysuria.  She had a device from [facility] given to her because she participates in the study day or where she can check her urine.  Device tells her she may have a UTI.  Keflex was called in for her 250 mg 4 times a day.  Was advised not to take Pyridium after October 30.  Noted to have a 3-hour dental appointment on Monday, November 1.  Office visit here advised for November 1    Review of Systems see above no fever chills back pain nausea or vomiting     Objective:   Physical Exam She is afebrile.  Weight 156 pounds.  BMI 25.56.  No CVA tenderness.  Dipstick UA is completely normal.       Assessment & Plan:  Dysuria treated over the weekend with Keflex and improved.  Dipstick UA today is completely normal.  Since UA today was normal did not get culture today.  Plan: Recommend completing 5-day course of Keflex.

## 2020-07-24 NOTE — Patient Instructions (Signed)
Recommend completing 5-day course of Keflex.

## 2020-08-20 DIAGNOSIS — Z20822 Contact with and (suspected) exposure to covid-19: Secondary | ICD-10-CM | POA: Diagnosis not present

## 2020-08-23 DIAGNOSIS — Z20822 Contact with and (suspected) exposure to covid-19: Secondary | ICD-10-CM | POA: Diagnosis not present

## 2020-09-08 DIAGNOSIS — N905 Atrophy of vulva: Secondary | ICD-10-CM | POA: Diagnosis not present

## 2020-09-08 DIAGNOSIS — R309 Painful micturition, unspecified: Secondary | ICD-10-CM | POA: Diagnosis not present

## 2020-10-12 ENCOUNTER — Ambulatory Visit (INDEPENDENT_AMBULATORY_CARE_PROVIDER_SITE_OTHER): Payer: Medicare Other | Admitting: Internal Medicine

## 2020-10-12 ENCOUNTER — Encounter: Payer: Self-pay | Admitting: Internal Medicine

## 2020-10-12 ENCOUNTER — Other Ambulatory Visit: Payer: Self-pay

## 2020-10-12 ENCOUNTER — Telehealth: Payer: Self-pay | Admitting: Internal Medicine

## 2020-10-12 VITALS — BP 120/80 | HR 76 | Temp 98.7°F | Ht 65.5 in | Wt 156.0 lb

## 2020-10-12 DIAGNOSIS — R300 Dysuria: Secondary | ICD-10-CM | POA: Diagnosis not present

## 2020-10-12 DIAGNOSIS — R319 Hematuria, unspecified: Secondary | ICD-10-CM | POA: Diagnosis not present

## 2020-10-12 DIAGNOSIS — Z8659 Personal history of other mental and behavioral disorders: Secondary | ICD-10-CM | POA: Diagnosis not present

## 2020-10-12 LAB — POCT URINALYSIS DIPSTICK
Appearance: NEGATIVE
Bilirubin, UA: NEGATIVE
Blood, UA: NEGATIVE
Glucose, UA: NEGATIVE
Ketones, UA: NEGATIVE
Leukocytes, UA: NEGATIVE
Nitrite, UA: NEGATIVE
Odor: NEGATIVE
Protein, UA: NEGATIVE
Spec Grav, UA: 1.015 (ref 1.010–1.025)
Urobilinogen, UA: 0.2 E.U./dL
pH, UA: 6.5 (ref 5.0–8.0)

## 2020-10-12 MED ORDER — CEPHALEXIN 250 MG PO CAPS: 250.0000 mg | ORAL_CAPSULE | Freq: Four times a day (QID) | ORAL | 0 refills | Status: DC

## 2020-10-12 NOTE — Telephone Encounter (Signed)
[name] [phone]  Cheyrl called to say on Saturday she had frequncey burning and blood in her urine so she started taking cephalexin that she had a home. She took 3 on Saturday, 4 on Sunday and says she has a refill to complete 7 days. Wants to know when she should come in to check and see if it is gone and if this is enough medicine.

## 2020-10-12 NOTE — Telephone Encounter (Signed)
4 pm today

## 2020-10-12 NOTE — Progress Notes (Signed)
   Subjective:    Patient ID: [name], female    DOB: March 14, 1946, 75 y.o.   MRN: [id number]  HPI On  Friday Feb 11, patient had burning on urination and spasm type feeling at end of urination. She decided to wait to see if symptoms improved and did not seek treatment. On Saturday, saw some specks of blood in urine. Took generic Keflex 250 mg 3 times  on Saturday and took 4 tabs of Keflex 250 mg yesterday. Using some estrogen cream twice a week per GYN.  No fever or shaking chills.  She is concerned about blood in urine.  Urine dipstick today is completely within normal limits without evidence of blood.  No LE or nitrite on dipstick UA today.  No protein in urine.  No glucose in urine.  Urine is clear to inspection.  Patient since then a number of years ago she saw Dr. Risa Grill for recurrent urinary infections.  She says they started in her 82s when she became sexually active and had continued ever since but she is now menopausal.  Dr. 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  She has not seen a urologist since that time.    Review of Systems no nausea vomiting or back pain.  No fever or shaking chills.     Objective:   Physical Exam Blood pressure 120/80, pulse 76, temperature 98.7 degrees pulse oximetry 98% weight 156 pounds BMI 25.56  No CVA tenderness.  Dipstick UA is normal.  Urine was sent today for microscopic and culture.       Assessment & Plan:  History of recurrent urinary tract infections-she became ill over the weekend and did not want to go to urgent care in view of the COVID-19 pandemic.  She has been given Keflex to have on hand for such an occasion and she started on that over the weekend with improvement.  Plan: Await urine microscopic and culture results from today.  I think she should take Keflex generic 250 mg 4 times a day to complete a 7-day course.  New prescription for Keflex 250 mg 4 times a day for 7 days number 28 capsules sent to pharmacy.

## 2020-10-12 NOTE — Telephone Encounter (Signed)
Scheduled

## 2020-10-12 NOTE — Patient Instructions (Addendum)
Urine culture and urine microscopic results pending.  Complete 7-day course of Keflex 250 mg 4 times a day.  May want to consider urology consultation if symptoms recur.

## 2020-10-13 DIAGNOSIS — L821 Other seborrheic keratosis: Secondary | ICD-10-CM | POA: Diagnosis not present

## 2020-10-13 DIAGNOSIS — D2371 Other benign neoplasm of skin of right lower limb, including hip: Secondary | ICD-10-CM | POA: Diagnosis not present

## 2020-10-13 DIAGNOSIS — D2272 Melanocytic nevi of left lower limb, including hip: Secondary | ICD-10-CM | POA: Diagnosis not present

## 2020-10-13 DIAGNOSIS — D2262 Melanocytic nevi of left upper limb, including shoulder: Secondary | ICD-10-CM | POA: Diagnosis not present

## 2020-10-13 DIAGNOSIS — L918 Other hypertrophic disorders of the skin: Secondary | ICD-10-CM | POA: Diagnosis not present

## 2020-10-13 DIAGNOSIS — L814 Other melanin hyperpigmentation: Secondary | ICD-10-CM | POA: Diagnosis not present

## 2020-10-13 DIAGNOSIS — D225 Melanocytic nevi of trunk: Secondary | ICD-10-CM | POA: Diagnosis not present

## 2020-10-13 DIAGNOSIS — D1801 Hemangioma of skin and subcutaneous tissue: Secondary | ICD-10-CM | POA: Diagnosis not present

## 2020-10-13 LAB — URINALYSIS, MICROSCOPIC ONLY
Bacteria, UA: NONE SEEN /HPF
Hyaline Cast: NONE SEEN /LPF
Squamous Epithelial / HPF: NONE SEEN /HPF (ref <=5)

## 2020-10-13 LAB — URINE CULTURE
MICRO NUMBER:: 11530917
Result:: NO GROWTH
SPECIMEN QUALITY:: ADEQUATE

## 2020-11-11 DIAGNOSIS — J300 Vasomotor rhinitis: Secondary | ICD-10-CM | POA: Diagnosis not present

## 2020-11-11 DIAGNOSIS — K219 Gastro-esophageal reflux disease without esophagitis: Secondary | ICD-10-CM | POA: Diagnosis not present

## 2020-11-11 DIAGNOSIS — R21 Rash and other nonspecific skin eruption: Secondary | ICD-10-CM | POA: Diagnosis not present

## 2020-11-11 DIAGNOSIS — R059 Cough, unspecified: Secondary | ICD-10-CM | POA: Diagnosis not present

## 2020-12-04 ENCOUNTER — Ambulatory Visit: Payer: Medicare Other | Admitting: Internal Medicine

## 2020-12-04 ENCOUNTER — Telehealth: Payer: Self-pay | Admitting: Internal Medicine

## 2020-12-04 DIAGNOSIS — N390 Urinary tract infection, site not specified: Secondary | ICD-10-CM | POA: Diagnosis not present

## 2020-12-04 DIAGNOSIS — R309 Painful micturition, unspecified: Secondary | ICD-10-CM | POA: Diagnosis not present

## 2020-12-04 NOTE — Telephone Encounter (Signed)
[name] [phone]  [name] called to say she thinks she has UTI, wanted to know how soon she could be seen, ask what her symptoms was, she said burning, frequent urination spasms. Gave her appointment for 12:00 today.

## 2021-01-26 DIAGNOSIS — Z87448 Personal history of other diseases of urinary system: Secondary | ICD-10-CM | POA: Diagnosis not present

## 2021-01-26 DIAGNOSIS — N760 Acute vaginitis: Secondary | ICD-10-CM | POA: Diagnosis not present

## 2021-01-26 DIAGNOSIS — R309 Painful micturition, unspecified: Secondary | ICD-10-CM | POA: Diagnosis not present

## 2021-04-20 ENCOUNTER — Other Ambulatory Visit: Payer: Medicare Other | Admitting: Internal Medicine

## 2021-04-20 ENCOUNTER — Other Ambulatory Visit: Payer: Self-pay

## 2021-04-20 DIAGNOSIS — Z8659 Personal history of other mental and behavioral disorders: Secondary | ICD-10-CM | POA: Diagnosis not present

## 2021-04-20 DIAGNOSIS — Z0000 Encounter for general adult medical examination without abnormal findings: Secondary | ICD-10-CM | POA: Diagnosis not present

## 2021-04-20 DIAGNOSIS — E785 Hyperlipidemia, unspecified: Secondary | ICD-10-CM | POA: Diagnosis not present

## 2021-04-20 DIAGNOSIS — F411 Generalized anxiety disorder: Secondary | ICD-10-CM | POA: Diagnosis not present

## 2021-04-20 DIAGNOSIS — H8109 Meniere's disease, unspecified ear: Secondary | ICD-10-CM | POA: Diagnosis not present

## 2021-04-20 DIAGNOSIS — Z853 Personal history of malignant neoplasm of breast: Secondary | ICD-10-CM | POA: Diagnosis not present

## 2021-04-20 DIAGNOSIS — Z1321 Encounter for screening for nutritional disorder: Secondary | ICD-10-CM

## 2021-04-20 DIAGNOSIS — M8580 Other specified disorders of bone density and structure, unspecified site: Secondary | ICD-10-CM | POA: Diagnosis not present

## 2021-04-20 NOTE — Addendum Note (Signed)
Addended by: Mady Haagensen on: 04/20/2021 09:45 AM   Modules accepted: Orders

## 2021-04-21 DIAGNOSIS — H8101 Meniere's disease, right ear: Secondary | ICD-10-CM | POA: Diagnosis not present

## 2021-04-21 DIAGNOSIS — H9041 Sensorineural hearing loss, unilateral, right ear, with unrestricted hearing on the contralateral side: Secondary | ICD-10-CM | POA: Diagnosis not present

## 2021-04-21 DIAGNOSIS — H838X1 Other specified diseases of right inner ear: Secondary | ICD-10-CM | POA: Diagnosis not present

## 2021-04-21 DIAGNOSIS — R42 Dizziness and giddiness: Secondary | ICD-10-CM | POA: Diagnosis not present

## 2021-04-21 LAB — COMPLETE METABOLIC PANEL WITH GFR
AG Ratio: 2.4 (calc) (ref 1.0–2.5)
ALT: 21 U/L (ref 6–29)
AST: 20 U/L (ref 10–35)
Albumin: 4.5 g/dL (ref 3.6–5.1)
Alkaline phosphatase (APISO): 72 U/L (ref 37–153)
BUN: 12 mg/dL (ref 7–25)
CO2: 30 mmol/L (ref 20–32)
Calcium: 9.4 mg/dL (ref 8.6–10.4)
Chloride: 100 mmol/L (ref 98–110)
Creat: 0.63 mg/dL (ref 0.60–1.00)
Globulin: 1.9 g/dL (calc) (ref 1.9–3.7)
Glucose, Bld: 94 mg/dL (ref 65–99)
Potassium: 4.9 mmol/L (ref 3.5–5.3)
Sodium: 138 mmol/L (ref 135–146)
Total Bilirubin: 0.6 mg/dL (ref 0.2–1.2)
Total Protein: 6.4 g/dL (ref 6.1–8.1)
eGFR: 94 mL/min/{1.73_m2} (ref >=60)

## 2021-04-21 LAB — CBC WITH DIFFERENTIAL/PLATELET
Absolute Monocytes: 340 cells/uL (ref 200–950)
Basophils Absolute: 20 cells/uL (ref 0–200)
Basophils Relative: 0.4 %
Eosinophils Absolute: 40 cells/uL (ref 15–500)
Eosinophils Relative: 0.8 %
HCT: 43.9 % (ref 35.0–45.0)
Hemoglobin: 15 g/dL (ref 11.7–15.5)
Lymphs Abs: 1465 cells/uL (ref 850–3900)
MCH: 31.3 pg (ref 27.0–33.0)
MCHC: 34.2 g/dL (ref 32.0–36.0)
MCV: 91.5 fL (ref 80.0–100.0)
MPV: 10.1 fL (ref 7.5–12.5)
Monocytes Relative: 6.8 %
Neutro Abs: 3135 cells/uL (ref 1500–7800)
Neutrophils Relative %: 62.7 %
Platelets: 177 10*3/uL (ref 140–400)
RBC: 4.8 10*6/uL (ref 3.80–5.10)
RDW: 11.9 % (ref 11.0–15.0)
Total Lymphocyte: 29.3 %
WBC: 5 10*3/uL (ref 3.8–10.8)

## 2021-04-21 LAB — LIPID PANEL
Cholesterol: 167 mg/dL (ref <200)
HDL: 62 mg/dL (ref >=50)
LDL Cholesterol (Calc): 84 mg/dL (calc)
Non-HDL Cholesterol (Calc): 105 mg/dL (calc) (ref <130)
Total CHOL/HDL Ratio: 2.7 (calc) (ref <5.0)
Triglycerides: 111 mg/dL (ref <150)

## 2021-04-21 LAB — TSH: TSH: 3.57 mIU/L (ref 0.40–4.50)

## 2021-04-21 LAB — VITAMIN D 25 HYDROXY (VIT D DEFICIENCY, FRACTURES): Vit D, 25-Hydroxy: 42 ng/mL (ref 30–100)

## 2021-04-22 ENCOUNTER — Other Ambulatory Visit: Payer: Medicare Other | Admitting: Internal Medicine

## 2021-04-23 DIAGNOSIS — Z87440 Personal history of urinary (tract) infections: Secondary | ICD-10-CM | POA: Diagnosis not present

## 2021-04-23 DIAGNOSIS — N952 Postmenopausal atrophic vaginitis: Secondary | ICD-10-CM | POA: Diagnosis not present

## 2021-04-23 DIAGNOSIS — R3129 Other microscopic hematuria: Secondary | ICD-10-CM | POA: Diagnosis not present

## 2021-04-23 DIAGNOSIS — Z87891 Personal history of nicotine dependence: Secondary | ICD-10-CM | POA: Diagnosis not present

## 2021-04-26 ENCOUNTER — Ambulatory Visit (INDEPENDENT_AMBULATORY_CARE_PROVIDER_SITE_OTHER): Payer: Medicare Other | Admitting: Internal Medicine

## 2021-04-26 ENCOUNTER — Encounter: Payer: Self-pay | Admitting: Internal Medicine

## 2021-04-26 ENCOUNTER — Other Ambulatory Visit: Payer: Self-pay

## 2021-04-26 VITALS — BP 110/70 | HR 73 | Ht 65.5 in | Wt 155.0 lb

## 2021-04-26 DIAGNOSIS — H8109 Meniere's disease, unspecified ear: Secondary | ICD-10-CM

## 2021-04-26 DIAGNOSIS — Z853 Personal history of malignant neoplasm of breast: Secondary | ICD-10-CM

## 2021-04-26 DIAGNOSIS — Z8659 Personal history of other mental and behavioral disorders: Secondary | ICD-10-CM | POA: Diagnosis not present

## 2021-04-26 DIAGNOSIS — Z882 Allergy status to sulfonamides status: Secondary | ICD-10-CM | POA: Diagnosis not present

## 2021-04-26 DIAGNOSIS — G4709 Other insomnia: Secondary | ICD-10-CM | POA: Diagnosis not present

## 2021-04-26 DIAGNOSIS — Z87440 Personal history of urinary (tract) infections: Secondary | ICD-10-CM

## 2021-04-26 DIAGNOSIS — Z0000 Encounter for general adult medical examination without abnormal findings: Secondary | ICD-10-CM

## 2021-04-26 LAB — POCT URINALYSIS DIPSTICK
Appearance: NEGATIVE
Bilirubin, UA: NEGATIVE
Blood, UA: NEGATIVE
Glucose, UA: NEGATIVE
Ketones, UA: NEGATIVE
Leukocytes, UA: NEGATIVE
Nitrite, UA: NEGATIVE
Odor: NEGATIVE
Protein, UA: NEGATIVE
Spec Grav, UA: 1.01 (ref 1.010–1.025)
Urobilinogen, UA: 0.2 E.U./dL
pH, UA: 6.5 (ref 5.0–8.0)

## 2021-04-26 NOTE — Progress Notes (Signed)
Subjective:    Patient ID: [name], female    DOB: 11-10-1945, 75 y.o.   MRN: [id number]  HPI 75 year old Female for Medicare wellness, health maintenance exam, and evaluation of medical issues.  In February 19, she went to the emergency department with chest pain.  MI was ruled out.  She saw Dr. Percival Spanish for follow-up and was diagnosed with atypical chest pain.  He agreed that she should remain on statin medication.  She is on Lipitor 10 mg daily.  Lipids are normal.  In fact all of her fasting labs in association with this appointment are normal including CBC with differential c-Met, TSH and vitamin D level.  Urine dipstick is normal.  She has a history of recurrent urinary infections but none recently.  She is intolerant to many antibiotics including Macrodantin, sulfa, Avelox.  She can take Keflex.  Has been diagnosed with Mnire's disease by ENT physician but does not take chronic medication for it.  Had colonoscopy by Dr. Earlean Shawl in 2014.  Apparently 10-year follow-up recommended.  Uterine polyp removed in 2010.  Fractured toe in 2011.  Left [place] shoulder 1993.  She had bilateral mastectomies in 1993 by Dr. Margot Chimes.  She had chemotherapy for breast cancer.  History of lumbar disc disease L4-S1 with annular disc bulging and osteophytosis.  History of sinusitis January 2016 treated with Zithromax and Depo-Medrol.  Social history: She is married.  Husband is a retired Systems developer judge for the [employer].  She does not smoke.  Social alcohol consumption.  1 daughter.  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  Family history: Father with history of MI, hypertension and stroke.  Mother died at age 10 of a stroke.  History of GE reflux treated with over-the-counter Pepcid.  Bilateral cataract extractions in 2017.  History of pseudophakia both eyes status post laser treatment in June and August 2017 and also April  2018.  Hand consultation August 26 at [facility] regarding microscopic hematuria.  Also advice given regarding UTI prevention including topical estrogen, increasing fluid intake and proanthocyanidin supplement 36 mg daily.  It is derived from cranberries.  D-mannose powder recommended 2 g daily.  Probiotic recommended as well.    Review of Systems  Constitutional: Negative.   Respiratory: Negative.    Cardiovascular: Negative.   Gastrointestinal: Negative.   Genitourinary: Negative.   Neurological: Negative.   Psychiatric/Behavioral: Negative.        Objective:   Physical Exam Blood pressure 110/70 pulse 73 pulse oximetry 98% weight 155 pounds height 5 feet 5.5 inches BMI 25.40  Skin: Warm and dry.  Nodes none.  TMs clear.  Neck supple.  Chest is clear.  Cardiac exam: Regular rate and rhythm without ectopy or murmur.  Abdomen is soft nondistended without hepatosplenomegaly masses or tenderness.  GYN exam is deferred to gynecologist.  No lower extremity pitting edema.  No focal deficits on brief neurological exam.  Affect, thought, judgment appear to be normal.       Assessment & Plan:  Labs are reviewed and are stable/within normal limits  Remote history of breast cancer  History of recurrent urinary infections  Hyperlipidemia treated with low-dose Lipitor  History of Mnire's disease but no recent episodes  Osteopenia  History of insomnia treated with as needed lorazepam  History of bilateral cataract surgery  GE reflux treated with over-the-counter Pepcid  Reviewed consultation with urology clinic and  their recommendations.  Fortunately has not had recent UTIs.  Plan: Recommend COVID booster this Fall.  Recommend flu vaccine.  Pneumococcal vaccines are up-to-date. Return in 1 year or as needed.   Subjective:   Patient presents for Medicare Annual/Subsequent preventive examination.  Review Past Medical/Family/Social: See  above   Risk Factors  Current exercise habits: Plays tennis and is physically active Dietary issues discussed: Low-fat low carbohydrate diet  Cardiac risk factors: Hyperlipidemia  Depression Screen  (Note: if answer to either of the following is "Yes", a more complete depression screening is indicated)   Over the past two weeks, have you felt down, depressed or hopeless? No  Over the past two weeks, have you felt little interest or pleasure in doing things? No Have you lost interest or pleasure in daily life? No Do you often feel hopeless? No Do you cry easily over simple problems? No   Activities of Daily Living  In your present state of health, do you have any difficulty performing the following activities?:   Driving? No  Managing money? No  Feeding yourself? No  Getting from bed to chair? No  Climbing a flight of stairs? No  Preparing food and eating?: No  Bathing or showering? No  Getting dressed: No  Getting to the toilet? No  Using the toilet:No  Moving around from place to place: No  In the past year have you fallen or had a near fall?:No  Are you sexually active? yes Do you have more than one partner? No   Hearing Difficulties:  Do you often ask people to speak up or repeat themselves? Not often Do you experience ringing or noises in your ears? No  Do you have difficulty understanding soft or whispered voices? No  Do you feel that you have a problem with memory? No Do you often misplace items?  Yes   Home Safety:  Do you have a smoke alarm at your residence? Yes Do you have grab bars in the bathroom?  None Do you have throw rugs in your house?  No   Cognitive Testing  Alert? Yes Normal Appearance?Yes  Oriented to person? Yes Place? Yes  Time? Yes  Recall of three objects? Yes  Can perform simple calculations? Yes  Displays appropriate judgment?Yes  Can read the correct time from a watch face?Yes   List the Names of Other Physician/Practitioners you  currently use:  See referral list for the physicians patient is currently seeing.  [facility]rology clinic [facility] with [facility]   Review of Systems:   Objective:     General appearance: Appears younger than stated age and seen Head: Normocephalic, without obvious abnormality, atraumatic  Eyes: conj clear, EOMi PEERLA  Ears: normal TM's and external ear canals both ears  Nose: Nares normal. Septum midline. Mucosa normal. No drainage or sinus tenderness.  Throat: lips, mucosa, and tongue normal; teeth and gums normal  Neck: no adenopathy, no carotid bruit, no JVD, supple, symmetrical, trachea midline and thyroid not enlarged, symmetric, no tenderness/mass/nodules  No CVA tenderness.  Lungs: clear to auscultation bilaterally  Breasts: Bilateral mastectomies Heart: regular rate and rhythm, S1, S2 normal, no murmur, click, rub or gallop  Abdomen: soft, non-tender; bowel sounds normal; no masses, no organomegaly  Musculoskeletal: ROM normal in all joints, no crepitus, no deformity, Normal muscle strengthen. Back  is symmetric, no curvature. Skin: Skin color, texture, turgor normal. No rashes or lesions  Lymph nodes: Cervical, supraclavicular, and axillary nodes normal.  Neurologic:  CN 2 -12 Normal, Normal symmetric reflexes. Normal coordination and gait  Psych: Alert & Oriented x 3, Mood appear stable.    Assessment:    Annual wellness medicare exam   Plan:    During the course of the visit the patient was educated and counseled about appropriate screening and preventive services including:   Have COVID booster this Fall.  Have flu vaccine      Patient Instructions (the written plan) was given to the patient.  Medicare Attestation  I have personally reviewed:  The patient's medical and social history  Their use of alcohol, tobacco or illicit drugs  Their current medications and supplements  The patient's functional ability including ADLs,fall risks,  home safety risks, cognitive, and hearing and visual impairment  Diet and physical activities  Evidence for depression or mood disorders  The patient's weight, height, BMI, and visual acuity have been recorded in the chart. I have made referrals, counseling, and provided education to the patient based on review of the above and I have provided the patient with a written personalized care plan for preventive services.

## 2021-05-02 ENCOUNTER — Other Ambulatory Visit: Payer: Self-pay | Admitting: Internal Medicine

## 2021-05-23 MED ORDER — LORAZEPAM 1 MG PO TABS: 1.0000 mg | ORAL_TABLET | Freq: Every day | ORAL | 3 refills | Status: DC

## 2021-05-23 NOTE — Patient Instructions (Addendum)
It was a pleasure to see you today.  Continue current medications.  Return in 1 year or as needed.  Recommend COVID booster and flu vaccine this Fall.  Lab work is within normal limits.  Lorazepam refilled as requested

## 2021-06-02 DIAGNOSIS — Z779 Other contact with and (suspected) exposures hazardous to health: Secondary | ICD-10-CM | POA: Diagnosis not present

## 2021-06-02 DIAGNOSIS — Z124 Encounter for screening for malignant neoplasm of cervix: Secondary | ICD-10-CM | POA: Diagnosis not present

## 2021-06-02 DIAGNOSIS — Z6825 Body mass index (BMI) 25.0-25.9, adult: Secondary | ICD-10-CM | POA: Diagnosis not present

## 2021-06-02 DIAGNOSIS — N760 Acute vaginitis: Secondary | ICD-10-CM | POA: Diagnosis not present

## 2021-06-09 DIAGNOSIS — Z961 Presence of intraocular lens: Secondary | ICD-10-CM | POA: Diagnosis not present

## 2021-06-15 DIAGNOSIS — N760 Acute vaginitis: Secondary | ICD-10-CM | POA: Diagnosis not present

## 2021-06-15 DIAGNOSIS — R309 Painful micturition, unspecified: Secondary | ICD-10-CM | POA: Diagnosis not present

## 2021-08-11 DIAGNOSIS — N952 Postmenopausal atrophic vaginitis: Secondary | ICD-10-CM | POA: Diagnosis not present

## 2021-10-14 DIAGNOSIS — L814 Other melanin hyperpigmentation: Secondary | ICD-10-CM | POA: Diagnosis not present

## 2021-10-14 DIAGNOSIS — L821 Other seborrheic keratosis: Secondary | ICD-10-CM | POA: Diagnosis not present

## 2021-10-14 DIAGNOSIS — D225 Melanocytic nevi of trunk: Secondary | ICD-10-CM | POA: Diagnosis not present

## 2021-10-14 DIAGNOSIS — D2272 Melanocytic nevi of left lower limb, including hip: Secondary | ICD-10-CM | POA: Diagnosis not present

## 2021-10-14 DIAGNOSIS — D1801 Hemangioma of skin and subcutaneous tissue: Secondary | ICD-10-CM | POA: Diagnosis not present

## 2021-10-27 DIAGNOSIS — Z87891 Personal history of nicotine dependence: Secondary | ICD-10-CM | POA: Diagnosis not present

## 2021-10-27 DIAGNOSIS — R3129 Other microscopic hematuria: Secondary | ICD-10-CM | POA: Diagnosis not present

## 2021-11-09 DIAGNOSIS — J300 Vasomotor rhinitis: Secondary | ICD-10-CM | POA: Diagnosis not present

## 2021-11-09 DIAGNOSIS — K219 Gastro-esophageal reflux disease without esophagitis: Secondary | ICD-10-CM | POA: Diagnosis not present

## 2021-11-09 DIAGNOSIS — R21 Rash and other nonspecific skin eruption: Secondary | ICD-10-CM | POA: Diagnosis not present

## 2021-11-09 DIAGNOSIS — R059 Cough, unspecified: Secondary | ICD-10-CM | POA: Diagnosis not present

## 2021-11-21 ENCOUNTER — Other Ambulatory Visit: Payer: Self-pay | Admitting: Internal Medicine

## 2022-04-26 ENCOUNTER — Other Ambulatory Visit: Payer: Medicare Other

## 2022-04-26 DIAGNOSIS — Z8659 Personal history of other mental and behavioral disorders: Secondary | ICD-10-CM | POA: Diagnosis not present

## 2022-04-26 DIAGNOSIS — G4709 Other insomnia: Secondary | ICD-10-CM | POA: Diagnosis not present

## 2022-04-26 DIAGNOSIS — E7849 Other hyperlipidemia: Secondary | ICD-10-CM | POA: Diagnosis not present

## 2022-04-26 DIAGNOSIS — Z87440 Personal history of urinary (tract) infections: Secondary | ICD-10-CM

## 2022-04-26 DIAGNOSIS — Z853 Personal history of malignant neoplasm of breast: Secondary | ICD-10-CM | POA: Diagnosis not present

## 2022-04-26 DIAGNOSIS — J309 Allergic rhinitis, unspecified: Secondary | ICD-10-CM | POA: Diagnosis not present

## 2022-04-27 LAB — CBC WITH DIFFERENTIAL/PLATELET
Absolute Monocytes: 280 cells/uL (ref 200–950)
Basophils Absolute: 30 cells/uL (ref 0–200)
Basophils Relative: 0.6 %
Eosinophils Absolute: 60 cells/uL (ref 15–500)
Eosinophils Relative: 1.2 %
HCT: 45.3 % — ABNORMAL HIGH (ref 35.0–45.0)
Hemoglobin: 15.6 g/dL — ABNORMAL HIGH (ref 11.7–15.5)
Lymphs Abs: 1550 cells/uL (ref 850–3900)
MCH: 31 pg (ref 27.0–33.0)
MCHC: 34.4 g/dL (ref 32.0–36.0)
MCV: 90.1 fL (ref 80.0–100.0)
MPV: 10 fL (ref 7.5–12.5)
Monocytes Relative: 5.6 %
Neutro Abs: 3080 cells/uL (ref 1500–7800)
Neutrophils Relative %: 61.6 %
Platelets: 191 10*3/uL (ref 140–400)
RBC: 5.03 10*6/uL (ref 3.80–5.10)
RDW: 12.2 % (ref 11.0–15.0)
Total Lymphocyte: 31 %
WBC: 5 10*3/uL (ref 3.8–10.8)

## 2022-04-27 LAB — COMPLETE METABOLIC PANEL WITH GFR
AG Ratio: 2.3 (calc) (ref 1.0–2.5)
ALT: 12 U/L (ref 6–29)
AST: 18 U/L (ref 10–35)
Albumin: 4.5 g/dL (ref 3.6–5.1)
Alkaline phosphatase (APISO): 79 U/L (ref 37–153)
BUN: 16 mg/dL (ref 7–25)
CO2: 27 mmol/L (ref 20–32)
Calcium: 9.8 mg/dL (ref 8.6–10.4)
Chloride: 104 mmol/L (ref 98–110)
Creat: 0.75 mg/dL (ref 0.60–1.00)
Globulin: 2 g/dL (calc) (ref 1.9–3.7)
Glucose, Bld: 91 mg/dL (ref 65–99)
Potassium: 4.5 mmol/L (ref 3.5–5.3)
Sodium: 141 mmol/L (ref 135–146)
Total Bilirubin: 0.6 mg/dL (ref 0.2–1.2)
Total Protein: 6.5 g/dL (ref 6.1–8.1)
eGFR: 83 mL/min/{1.73_m2} (ref >=60)

## 2022-04-27 LAB — LIPID PANEL
Cholesterol: 168 mg/dL (ref <200)
HDL: 66 mg/dL (ref >=50)
LDL Cholesterol (Calc): 78 mg/dL (calc)
Non-HDL Cholesterol (Calc): 102 mg/dL (calc) (ref <130)
Total CHOL/HDL Ratio: 2.5 (calc) (ref <5.0)
Triglycerides: 139 mg/dL (ref <150)

## 2022-04-27 LAB — TSH: TSH: 4.12 mIU/L (ref 0.40–4.50)

## 2022-04-28 ENCOUNTER — Ambulatory Visit (INDEPENDENT_AMBULATORY_CARE_PROVIDER_SITE_OTHER): Payer: Medicare Other | Admitting: Internal Medicine

## 2022-04-28 ENCOUNTER — Encounter: Payer: Self-pay | Admitting: Internal Medicine

## 2022-04-28 VITALS — BP 112/78 | HR 76 | Temp 98.7°F | Ht 66.25 in | Wt 158.8 lb

## 2022-04-28 DIAGNOSIS — Z882 Allergy status to sulfonamides status: Secondary | ICD-10-CM

## 2022-04-28 DIAGNOSIS — Z87440 Personal history of urinary (tract) infections: Secondary | ICD-10-CM | POA: Diagnosis not present

## 2022-04-28 DIAGNOSIS — Z853 Personal history of malignant neoplasm of breast: Secondary | ICD-10-CM

## 2022-04-28 DIAGNOSIS — H8109 Meniere's disease, unspecified ear: Secondary | ICD-10-CM

## 2022-04-28 DIAGNOSIS — M8580 Other specified disorders of bone density and structure, unspecified site: Secondary | ICD-10-CM

## 2022-04-28 DIAGNOSIS — Z0000 Encounter for general adult medical examination without abnormal findings: Secondary | ICD-10-CM | POA: Diagnosis not present

## 2022-04-28 DIAGNOSIS — G4709 Other insomnia: Secondary | ICD-10-CM | POA: Diagnosis not present

## 2022-04-28 DIAGNOSIS — Z8659 Personal history of other mental and behavioral disorders: Secondary | ICD-10-CM | POA: Diagnosis not present

## 2022-04-28 DIAGNOSIS — Z09 Encounter for follow-up examination after completed treatment for conditions other than malignant neoplasm: Secondary | ICD-10-CM

## 2022-04-28 LAB — POCT URINALYSIS DIPSTICK
Bilirubin, UA: NEGATIVE
Blood, UA: NEGATIVE
Glucose, UA: NEGATIVE
Ketones, UA: NEGATIVE
Leukocytes, UA: NEGATIVE
Nitrite, UA: NEGATIVE
Protein, UA: NEGATIVE
Spec Grav, UA: 1.01 (ref 1.010–1.025)
Urobilinogen, UA: 0.2 E.U./dL
pH, UA: 7.5 (ref 5.0–8.0)

## 2022-04-28 NOTE — Progress Notes (Signed)
Annual Wellness Visit     Patient: [name], Female    DOB: Jan 07, 1946, 76 y.o.   MRN: [id number] Visit Date: 04/28/2022   Subjective    [name] is a 76 y.o. female who presents today for her Medicare annual Wellness Visit,  health maintenance exam, and evaluation of medical issues.  She has a history of recurrent urinary infections but none recently.  She is intolerant to many antibiotics including Macrodantin, sulfa, Avelox.  She can take Keflex.  Has been diagnosed with Mnire's disease by ENT physician but does not take chronic medication for it.  She had bilateral mastectomies in 1993 by Dr. Margot Chimes.  She had chemotherapy for breast cancer.  Had colonoscopy by Dr. Earlean Shawl in 2014 and apparently 10-year follow-up recommended which will be next year.  Had uterine polyp removed in 2010.  Fractured toe in 2011.  Bilateral cataract extractions in 2017.  History of pseudophakia of both eyes status post laser treatment June 20 14 April 2016 and April 2018.  History of GE reflux treated with over-the-counter Pepcid.  In 2019 she went to the emergency department with chest pain and MI was ruled out.  She saw Dr. Percival Spanish and was diagnosed with atypical chest pain.  He agreed that she remain on statin medication and she currently is on low-dose Lipitor.  History of lumbar disc disease L4-S1 with annular disc bulging and osteophytosis.  Had sinusitis January 2016 treated with Zithromax and Depo-Medrol.  Social history: Husband is retired and formerly was in Education officer, museum for the [employer].  She does not smoke.  Social alcohol consumption.  1 daughter.  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  Family history: Father with history of MI, hypertension and stroke.  Mother died at age 110 of a stroke.          Review of Systems she feels well and has no complaints.  Denies chest pain, shortness of breath,  palpitations, issues with bowel or bladder.  No neurological issues or psychiatric issues.   Objective    Vitals: Temperature 98.7 degrees pulse 76 regular blood pressure 112/78 weight 158 pounds 12.8 ounces height 5 feet 6.25 inches BMI 25.44  Physical Exam Skin: Warm and dry.  No cervical adenopathy.  Chest clear.  Cardiac exam: Regular rate and rhythm.  Abdomen is soft nondistended without hepatosplenomegaly masses or tenderness.  No lower extremity edema.  Brief neurological exam is intact without gross focal deficits.  Affect thought and judgment appear to be normal.  Most recent functional status assessment:    04/28/2022   11:02 AM  In your present state of health, do you have any difficulty performing the following activities:  Hearing? 0  Vision? 0  Difficulty concentrating or making decisions? 0  Walking or climbing stairs? 0  Dressing or bathing? 0  Doing errands, shopping? 0  Preparing Food and eating ? N  Using the Toilet? N  In the past six months, have you accidently leaked urine? N  Do you have problems with loss of bowel control? N  Managing your Medications? N  Managing your Finances? N  Housekeeping or managing your Housekeeping? N   Most recent fall risk assessment:    04/28/2022   11:02 AM  Fall Risk   Falls in the past year? 0  Number falls in past yr: 0  Injury with Fall? 0  Risk for fall due to : No  Fall Risks  Follow up Falls evaluation completed    Most recent depression screenings:    04/28/2022   11:02 AM 04/26/2021    3:08 PM  PHQ 2/9 Scores  PHQ - 2 Score 0 0   Most recent cognitive screening:    04/28/2022   11:04 AM  6CIT Screen  What Year? 0 points  What month? 0 points  What time? 0 points  Count back from 20 0 points  Months in reverse 0 points  Repeat phrase 0 points  Total Score 0 points       Assessment & Plan   Patient has prior history of recurrent urinary infections but none recently  GE reflux treated with  over-the-counter Pepcid  History of lumbar disc disease L4-S1 history of bilateral mastectomies and had chemotherapy for breast cancer [place] maintenance-will need colonoscopy in 2024.  Dr. Earlean Shawl has retired.  Has been diagnosed with Mnire's disease but does not take chronic medication for it.  Hyperlipidemia treated with low-dose Lipitor  History of osteopenia  History of insomnia treated with as needed lorazepam  History of bilateral cataract surgery  Plan: Immunizations discussed.  Colonoscopy due 2024.  Her fasting labs are reviewed and are entirely within normal limits.  Dipstick UA is also normal.  Plan: Recommend COVID booster in the Fall with new variant.  Recommend annual flu vaccine.  Consider RSV vaccine.  Return in 1 year or as needed.  Colonoscopy due 2024.      Annual wellness visit done today including the all of the following: Reviewed patient's Family Medical History Reviewed and updated list of patient's medical providers Assessment of cognitive impairment was done Assessed patient's functional ability Established a written schedule for health screening [place] Completed and Reviewed  Discussed health benefits of physical activity, and encouraged her to engage in regular exercise appropriate for her age and condition.         {I, Elby Showers, MD, have reviewed all documentation for this visit. The documentation on 05/07/22 for the exam, diagnosis, procedures, and orders are all accurate and complete.   LaVon Barron Alvine, CMA

## 2022-04-28 NOTE — Patient Instructions (Addendum)
Vaccines discussed. Bone density ordered. It was a pleasure to you today.  Return in 1 year or as needed.

## 2022-05-09 ENCOUNTER — Telehealth: Payer: Self-pay

## 2022-05-11 ENCOUNTER — Encounter: Payer: Self-pay | Admitting: Gastroenterology

## 2022-05-27 ENCOUNTER — Other Ambulatory Visit: Payer: Self-pay | Admitting: Internal Medicine

## 2022-06-01 ENCOUNTER — Ambulatory Visit (AMBULATORY_SURGERY_CENTER): Payer: Self-pay

## 2022-06-01 VITALS — Ht 66.0 in | Wt 158.0 lb

## 2022-06-01 DIAGNOSIS — Z1211 Encounter for screening for malignant neoplasm of colon: Secondary | ICD-10-CM

## 2022-06-01 NOTE — Progress Notes (Signed)
No egg or soy allergy known to patient  No issues known to pt with past sedation with any surgeries or procedures Patient denies ever being told they had issues or difficulty with intubation  No FH of Malignant Hyperthermia Pt is not on diet pills Pt is not on  home 02  Pt is not on blood thinners  Pt denies issues with constipation  No A fib or A flutter Have any cardiac testing pending--no Pt instructed to use Singlecare.com or GoodRx for a price reduction on prep   

## 2022-06-07 DIAGNOSIS — Z6825 Body mass index (BMI) 25.0-25.9, adult: Secondary | ICD-10-CM | POA: Diagnosis not present

## 2022-06-07 DIAGNOSIS — N952 Postmenopausal atrophic vaginitis: Secondary | ICD-10-CM | POA: Diagnosis not present

## 2022-06-07 DIAGNOSIS — Z853 Personal history of malignant neoplasm of breast: Secondary | ICD-10-CM | POA: Diagnosis not present

## 2022-06-07 DIAGNOSIS — Z124 Encounter for screening for malignant neoplasm of cervix: Secondary | ICD-10-CM | POA: Diagnosis not present

## 2022-06-16 ENCOUNTER — Ambulatory Visit (AMBULATORY_SURGERY_CENTER): Payer: Medicare Other | Admitting: Gastroenterology

## 2022-06-16 ENCOUNTER — Encounter: Payer: Self-pay | Admitting: Gastroenterology

## 2022-06-16 VITALS — BP 106/47 | HR 57 | Temp 97.4°F | Resp 13 | Ht 66.25 in | Wt 158.0 lb

## 2022-06-16 DIAGNOSIS — Z1211 Encounter for screening for malignant neoplasm of colon: Secondary | ICD-10-CM

## 2022-06-16 DIAGNOSIS — Z09 Encounter for follow-up examination after completed treatment for conditions other than malignant neoplasm: Secondary | ICD-10-CM

## 2022-06-16 DIAGNOSIS — Z86010 Personal history of colonic polyps: Secondary | ICD-10-CM | POA: Diagnosis not present

## 2022-06-16 DIAGNOSIS — D123 Benign neoplasm of transverse colon: Secondary | ICD-10-CM | POA: Diagnosis not present

## 2022-06-16 DIAGNOSIS — Z83719 Family history of colon polyps, unspecified: Secondary | ICD-10-CM

## 2022-06-16 DIAGNOSIS — D120 Benign neoplasm of cecum: Secondary | ICD-10-CM

## 2022-06-16 MED ORDER — SODIUM CHLORIDE 0.9 % IV SOLN: 500.0000 mL | Freq: Once | INTRAVENOUS | Status: DC

## 2022-06-16 NOTE — Progress Notes (Signed)
Called to room to assist during endoscopic procedure.  Patient ID and intended procedure confirmed with present staff. Received instructions for my participation in the procedure from the performing physician.  

## 2022-06-16 NOTE — Patient Instructions (Signed)
Handout on polyps, diverticulosis and high fiber given.  Follow high fiber diet. Drink at least 64oz of water daily. Add a daily stool bulking agents such as metamucil.    YOU HAD AN ENDOSCOPIC PROCEDURE TODAY AT [facility]:   Refer to the procedure report that was given to you for any specific questions about what was found during the examination.  If the procedure report does not answer your questions, please call your gastroenterologist to clarify.  If you requested that your care partner not be given the details of your procedure findings, then the procedure report has been included in a sealed envelope for you to review at your convenience later.  YOU SHOULD EXPECT: Some feelings of bloating in the abdomen. Passage of more gas than usual.  Walking can help get rid of the air that was put into your GI tract during the procedure and reduce the bloating. If you had a lower endoscopy (such as a colonoscopy or flexible sigmoidoscopy) you may notice spotting of blood in your stool or on the toilet paper. If you underwent a bowel prep for your procedure, you may not have a normal bowel movement for a few days.  Please Note:  You might notice some irritation and congestion in your nose or some drainage.  This is from the oxygen used during your procedure.  There is no need for concern and it should clear up in a day or so.  SYMPTOMS TO REPORT IMMEDIATELY:  Following lower endoscopy (colonoscopy or flexible sigmoidoscopy):  Excessive amounts of blood in the stool  Significant tenderness or worsening of abdominal pains  Swelling of the abdomen that is new, acute  Fever of 100F or higher   For urgent or emergent issues, a gastroenterologist can be reached at any hour by calling [phone]. Do not use MyChart messaging for urgent concerns.    DIET:  We do recommend a small meal at first, but then you may proceed to your regular diet.  Drink plenty of fluids but you should avoid  alcoholic beverages for 24 hours.  ACTIVITY:  You should plan to take it easy for the rest of today and you should NOT DRIVE or use heavy machinery until tomorrow (because of the sedation medicines used during the test).    FOLLOW UP: Our staff will call the number listed on your records the next business day following your procedure.  We will call around 7:15- 8:00 am to check on you and address any questions or concerns that you may have regarding the information given to you following your procedure. If we do not reach you, we will leave a message.     If any biopsies were taken you will be contacted by phone or by letter within the next 1-3 weeks.  Please call us at [phone] if you have not heard about the biopsies in 3 weeks.    SIGNATURES/CONFIDENTIALITY: You and/or your care partner have signed paperwork which will be entered into your electronic medical record.  These signatures attest to the fact that that the information above on your After Visit Summary has been reviewed and is understood.  Full responsibility of the confidentiality of this discharge information lies with you and/or your care-partner.

## 2022-06-16 NOTE — Progress Notes (Signed)
Referring Provider: Elby Showers, MD Primary Care Physician:  Elby Showers, MD  Indication for Colonoscopy:  Colon cancer screening   IMPRESSION:  Need for colon cancer screening Appropriate candidate for monitored anesthesia care  PLAN: Colonoscopy in the [place] today   HPI: [name] is a 76 y.o. female presents for screening colonoscopy.  Prior colonoscopies with Dr. Allyn Kenner - last 9 years ago.   Father with colon polyps. No other known family history of colon cancer or polyps. No family history of uterine/endometrial cancer, pancreatic cancer or gastric/stomach cancer.   Past Medical History:  Diagnosis Date   Allergy    Arthritis    Breast cancer ([place]) 1993   Cataract 01/14/2015   Chronic UTI    Dyspareunia    Eustachian tube dysfunction    GERD (gastroesophageal reflux disease)    Hyperlipidemia    Osteopenia    Rhinitis, nonallergic     Past Surgical History:  Procedure Laterality Date   CATARACT EXTRACTION     endometrial polyp  2010   Dr. Quincy Simmonds   MASTECTOMY  1993   bilateral    Current Outpatient Medications  Medication Sig Dispense Refill   atorvastatin (LIPITOR) 10 MG tablet TAKE 1 TABLET(10 MG) BY MOUTH DAILY 90 tablet 3   Cholecalciferol (VITAMIN D3) 2000 units TABS Take 2,000 Units by mouth daily.     conjugated estrogens (PREMARIN) vaginal cream daily.     Cranberry, Vacc oxycoccus, (CRANBERRY EXTRACT) 200 MG CAPS      famotidine (PEPCID) 10 MG tablet Take 10 mg by mouth 2 (two) times daily.     fish oil-omega-3 fatty acids 1000 MG capsule Take 1 g by mouth daily.     fluticasone (FLONASE ALLERGY RELIEF) 50 MCG/ACT nasal spray Flonase Allergy Relief     fluticasone (FLONASE ALLERGY RELIEF) 50 MCG/ACT nasal spray Flonase Allergy Relief     LORazepam (ATIVAN) 1 MG tablet TAKE 1 TABLET(1 MG) BY MOUTH AT BEDTIME 30 tablet 2   meclizine (ANTIVERT) 25 MG tablet TAKE 1 TABLET (25 MG TOTAL) BY MOUTH 3 (THREE) TIMES DAILY AS NEEDED FOR DIZZINESS.  30 tablet 0   montelukast (SINGULAIR) 10 MG tablet Take 10 mg by mouth every evening.  2   Propylene Glycol 0.6 % SOLN Place 1-2 drops into both eyes daily as needed (for dryness).      Current Facility-Administered Medications  Medication Dose Route Frequency Provider Last Rate Last Admin   0.9 %  sodium chloride infusion  500 mL Intravenous Once [place] Park, MD        Allergies as of 06/16/2022 - Review Complete 06/16/2022  Allergen Reaction Noted   Macrodantin Hives and Swelling 06/22/2011   Nitrofuran derivatives Hives 05/03/2014   Sulfa antibiotics Swelling 11/02/2015   Avelox [moxifloxacin hcl in nacl] Hives and Rash 09/09/2014    Family History  Problem Relation Age of Onset   Stroke Mother    Hypertension Mother    Colon polyps Father    Hypertension Father    Prostate cancer Father 24   Diabetes Maternal Grandfather    Breast cancer Paternal Grandmother        dx in her 70s   Colon cancer Neg Hx    Esophageal cancer Neg Hx    Stomach cancer Neg Hx    Rectal cancer Neg Hx      Physical Exam: General:   Alert,  well-nourished, pleasant and cooperative in NAD Head:  Normocephalic and atraumatic. Eyes:  Sclera clear, no icterus.   Conjunctiva pink. Mouth:  No deformity or lesions.   Neck:  Supple; no masses or thyromegaly. Lungs:  Clear throughout to auscultation.   No wheezes. Heart:  Regular rate and rhythm; no murmurs. Abdomen:  Soft, non-tender, nondistended, normal bowel sounds, no rebound or guarding.  Msk:  Symmetrical. No boney deformities LAD: No inguinal or umbilical LAD Extremities:  No clubbing or edema. Neurologic:  Alert and  oriented x4;  grossly nonfocal Skin:  No obvious rash or bruise. Psych:  Alert and cooperative. Normal mood and affect.     Studies/Results: No results found.    Vana Arif L. Tarri Glenn, MD, MPH 06/16/2022, 9:35 AM

## 2022-06-16 NOTE — Progress Notes (Signed)
VS completed by DT.  Pt's states no medical or surgical changes since previsit or office visit.  

## 2022-06-16 NOTE — Op Note (Signed)
[facility] Patient Name: [name] Procedure Date: 06/16/2022 9:38 AM MRN: [id number] Endoscopist: Thornton Park MD, MD Age: 76 Referring MD:  Date of Birth: [birth year] Gender: Female Account #: [id number] Procedure:                Colonoscopy Indications:              Surveillance: Personal history of adenomatous                            polyps on last colonoscopy > 5 years ago                           Prior colonoscopies with Dr. Allyn Kenner - last 9 years                            ago                           History of polyp on prior colonoscopy (report and                            path not available to me)                           Father with colon polyps Medicines:                Monitored Anesthesia Care Procedure:                Pre-Anesthesia Assessment:                           - Prior to the procedure, a History and Physical                            was performed, and patient medications and                            allergies were reviewed. The patient's tolerance of                            previous anesthesia was also reviewed. The risks                            and benefits of the procedure and the sedation                            options and risks were discussed with the patient.                            All questions were answered, and informed consent                            was obtained. Prior Anticoagulants: The patient has  taken no previous anticoagulant or antiplatelet                            agents. ASA Grade Assessment: II - A patient with                            mild systemic disease. After reviewing the risks                            and benefits, the patient was deemed in                            satisfactory condition to undergo the procedure.                           After obtaining informed consent, the colonoscope                            was passed under direct vision.  Throughout the                            procedure, the patient's blood pressure, pulse, and                            oxygen saturations were monitored continuously. The                            Olympus CF-HQ190L [id number]) Colonoscope was                            introduced through the anus and advanced to the 3                            cm into the ileum. A second forward view of the                            right colon was performed. The colonoscopy was                            performed without difficulty. The patient tolerated                            the procedure well. The quality of the bowel                            preparation was good. The terminal ileum, ileocecal                            valve, appendiceal orifice, and rectum were                            photographed. Scope In: 9:51:14 AM Scope Out: 10:07:24 AM Total Procedure Duration: 0 hours 16 minutes 10 seconds  Findings:  The perianal and digital rectal examinations were                            normal.                           Non-bleeding internal hemorrhoids were found.                           A few small and large-mouthed diverticula were                            found in the sigmoid colon, descending colon and                            ascending colon.                           Two sessile polyps were found in the hepatic                            flexure and cecum. The polyps were 3 mm in size.                            These polyps were removed with a cold snare.                            Resection and retrieval were complete. Estimated                            blood loss was minimal.                           The colon is redundant and tortuous. The exam was                            otherwise without abnormality on direct and                            retroflexion views. Complications:            No immediate complications. Estimated Blood Loss:     Estimated  blood loss was minimal. Impression:               - Non-bleeding internal hemorrhoids.                           - Diverticulosis in the sigmoid colon, in the                            descending colon and in the ascending colon.                           - Two 3 mm polyps at the hepatic flexure and in the  cecum, removed with a cold snare. Resected and                            retrieved.                           - The examination was otherwise normal on direct                            and retroflexion views. Recommendation:           - Patient has a contact number available for                            emergencies. The signs and symptoms of potential                            delayed complications were discussed with the                            patient. Return to normal activities tomorrow.                            Written discharge instructions were provided to the                            patient.                           - Continue present medications.                           - Await pathology results.                           - Follow a high fiber diet. Drink at least 64                            ounces of water daily. Add a daily stool bulking                            agent such as psyllium (an exampled would be                            Metamucil).                           - Emerging evidence supports eating a diet of                            fruits, vegetables, grains, calcium, and yogurt                            while reducing red meat and alcohol may reduce the  risk of colon cancer.                           - Repeat surveillance colonoscopy is not                            recommended due to current age. Thornton Park MD, MD 06/16/2022 10:13:13 AM This report has been signed electronically.

## 2022-06-16 NOTE — Progress Notes (Signed)
Pt resting comfortably. VSS. Airway intact. SBAR complete to RN. All questions answered.   

## 2022-06-17 ENCOUNTER — Telehealth: Payer: Self-pay

## 2022-06-17 NOTE — Telephone Encounter (Signed)
Follow up call placed, no answer, no VM.  

## 2022-08-17 DIAGNOSIS — Z961 Presence of intraocular lens: Secondary | ICD-10-CM | POA: Diagnosis not present

## 2022-08-17 DIAGNOSIS — Z9889 Other specified postprocedural states: Secondary | ICD-10-CM | POA: Diagnosis not present

## 2022-09-02 DIAGNOSIS — Z113 Encounter for screening for infections with a predominantly sexual mode of transmission: Secondary | ICD-10-CM | POA: Diagnosis not present

## 2022-09-02 DIAGNOSIS — N898 Other specified noninflammatory disorders of vagina: Secondary | ICD-10-CM | POA: Diagnosis not present

## 2022-09-02 DIAGNOSIS — N390 Urinary tract infection, site not specified: Secondary | ICD-10-CM | POA: Diagnosis not present

## 2022-09-02 DIAGNOSIS — N760 Acute vaginitis: Secondary | ICD-10-CM | POA: Diagnosis not present

## 2022-10-03 ENCOUNTER — Telehealth: Payer: Self-pay

## 2022-10-03 ENCOUNTER — Telehealth (INDEPENDENT_AMBULATORY_CARE_PROVIDER_SITE_OTHER): Payer: Medicare Other | Admitting: Internal Medicine

## 2022-10-03 ENCOUNTER — Encounter: Payer: Self-pay | Admitting: Internal Medicine

## 2022-10-03 VITALS — BP 110/70 | HR 68 | Temp 97.9°F | Wt 155.0 lb

## 2022-10-03 DIAGNOSIS — U071 COVID-19: Secondary | ICD-10-CM

## 2022-10-03 DIAGNOSIS — Z853 Personal history of malignant neoplasm of breast: Secondary | ICD-10-CM

## 2022-10-03 MED ORDER — HYDROCODONE BIT-HOMATROP MBR 5-1.5 MG/5ML PO SOLN: 5.0000 mL | Freq: Three times a day (TID) | ORAL | 0 refills | Status: DC | PRN

## 2022-10-03 NOTE — Telephone Encounter (Signed)
Patient called stating she had tested positive for Covid over the weekend and would like to discuss taking Paxlovid.  Patient was scheduled for virtual visit today

## 2022-10-03 NOTE — Telephone Encounter (Signed)
scheduled

## 2022-10-03 NOTE — Progress Notes (Signed)
   Subjective:    Patient ID: [name], female    DOB: Jul 06, 1946, 77 y.o.   MRN: [id number]  HPI 77 year old Female with remote history of breast cancer seen today via interactive audio and video telecommunications.  She is at her home and I am at my office.  She is identified using 2 identifiers as [name]. Klaas, a patient in this practice.  She is agreeable to visit in this format today.  Patient was recently exposed to someone with acute COVID-19 virus infection.  She began to feel poorly herself on Saturday evening February 3.  She had myalgias, low-grade fever and a mild headache.  Had no nausea or vomiting and no diarrhea.  Felt that she might have the flu.  Tested negative for COVID-19 yesterday but has tested positive today.    Review of Systems     Objective:   Physical Exam  She is seen virtually in no acute distress.  Does not appear to be tachypneic.  Not heard to be coughing on video visit.  Reports her pulse oximetry is 96% on room air.      Assessment & Plan:  Acute COVID-19 virus infection  Plan: Patient declines Paxlovid at this point in time since she appears to have a mild case.  She will monitor pulse oximetry at home.  She will walk around her house some to prevent atelectasis and stay well-hydrated.  She will call if symptoms worsen.  I did send in Hycodan 1 teaspoon every 8 hours as needed for cough.  Time spent with patient including chart review, E scribing medication and video interview is 10 minutes

## 2022-10-03 NOTE — Patient Instructions (Signed)
Patient declines prescription for Paxlovid for acute COVID-19 as she feels she has a mild case.  She will monitor pulse oximetry at home, will walk around her house some to prevent atelectasis of the lungs and stay well-hydrated.  She will call if symptoms worsen.  I did send in for her Hycodan 1 teaspoon every 8 hours as needed for cough.

## 2022-10-05 ENCOUNTER — Telehealth: Payer: Self-pay

## 2022-10-05 MED ORDER — BENZONATATE 100 MG PO CAPS: 100.0000 mg | ORAL_CAPSULE | Freq: Three times a day (TID) | ORAL | 0 refills | Status: DC | PRN

## 2022-10-05 NOTE — Telephone Encounter (Signed)
Patient called stating she had a virtual visit yesterday and declined the benzonatate but now has reconsidered  and would like Rx sent to the pharmacy

## 2022-10-05 NOTE — Addendum Note (Signed)
Addended by: Geradine Girt D on: 10/05/2022 12:42 PM   Modules accepted: Orders

## 2022-10-14 ENCOUNTER — Encounter: Payer: Self-pay | Admitting: Gastroenterology

## 2022-10-17 IMAGING — CR DG CHEST 2V
2 series · 2 of 2 positions shown · non-contrast
Comparison: 10/06/2017

CLINICAL DATA: History of breast cancer. Left arm/axillary swelling

EXAM:
CHEST - 2 VIEW

[w chest pa]
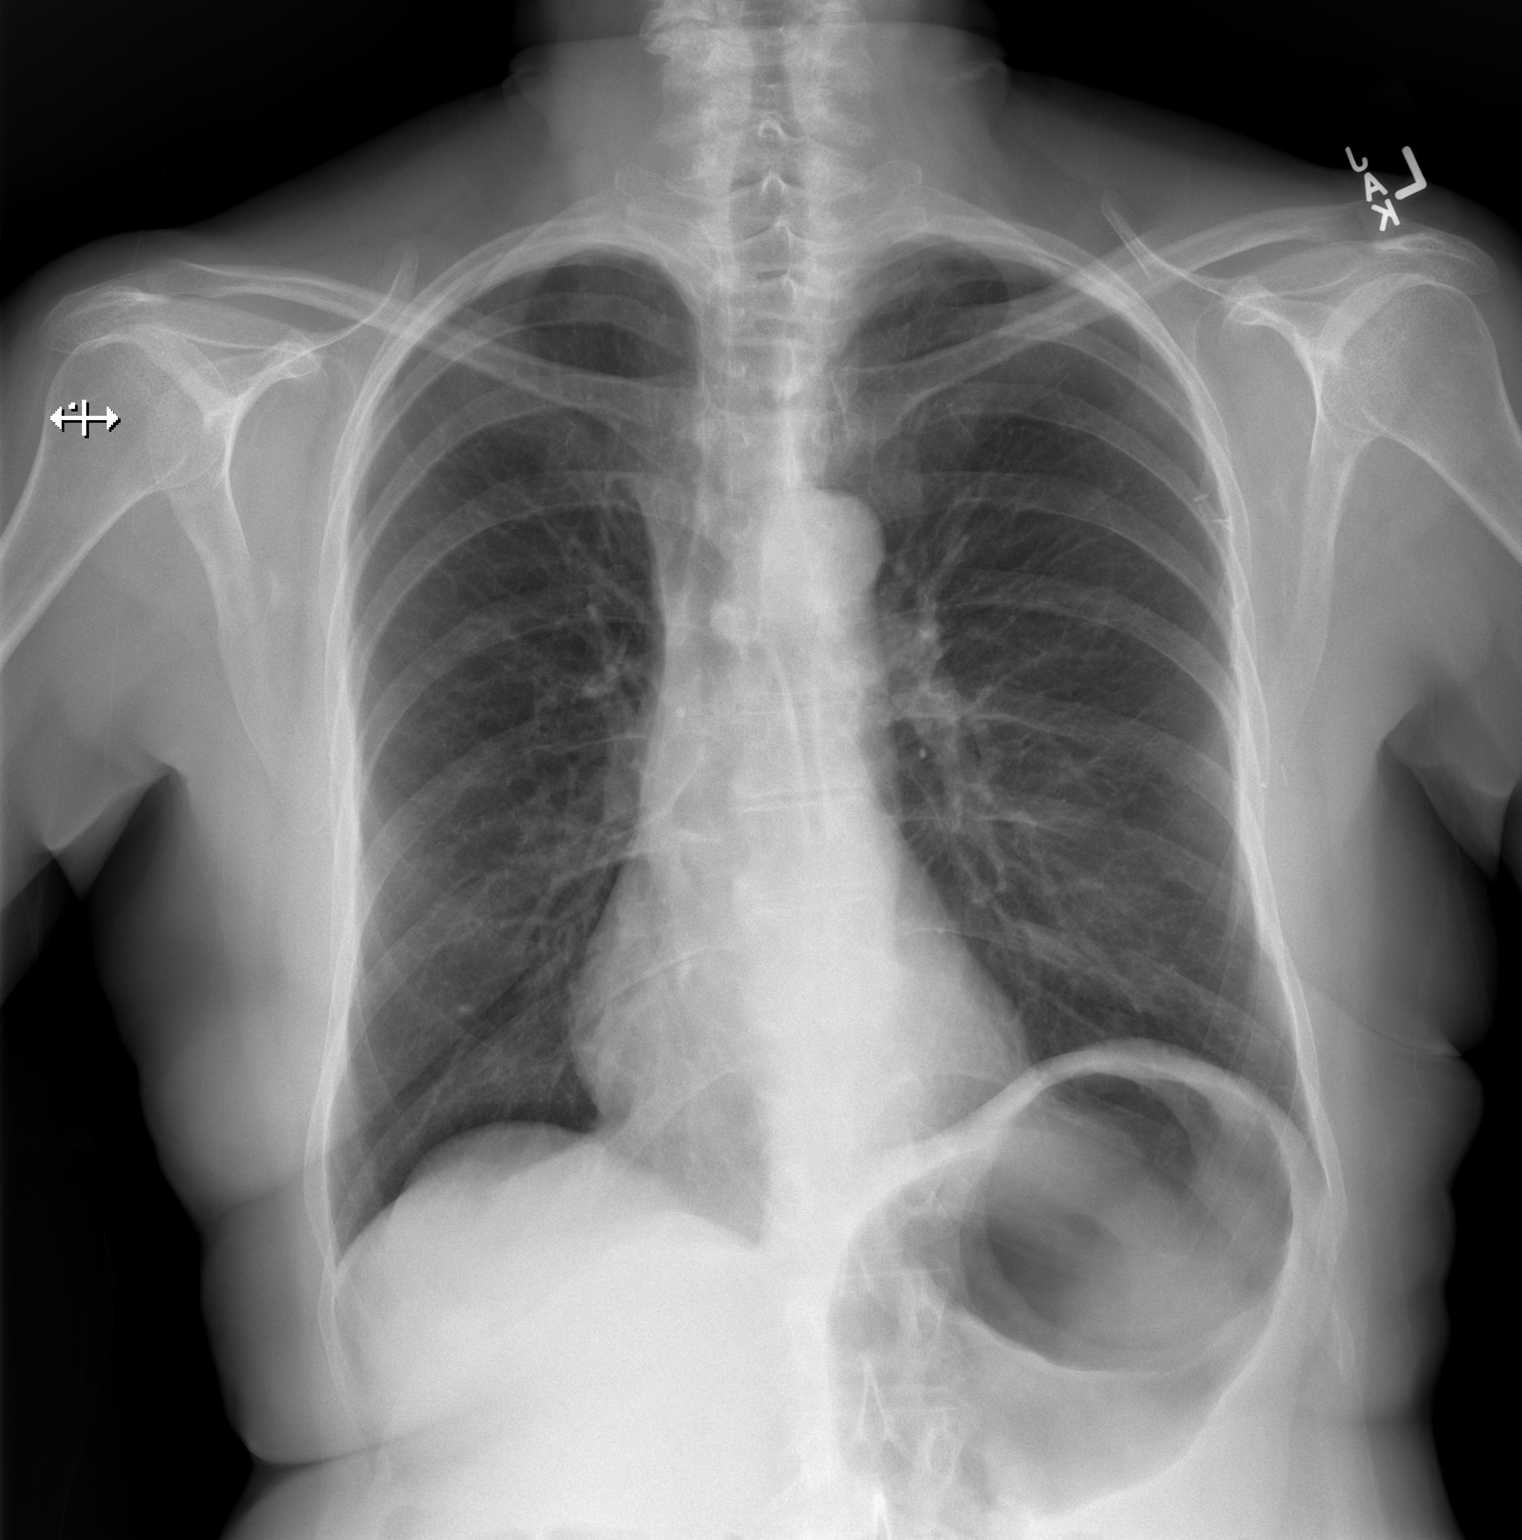

[w chest lat]
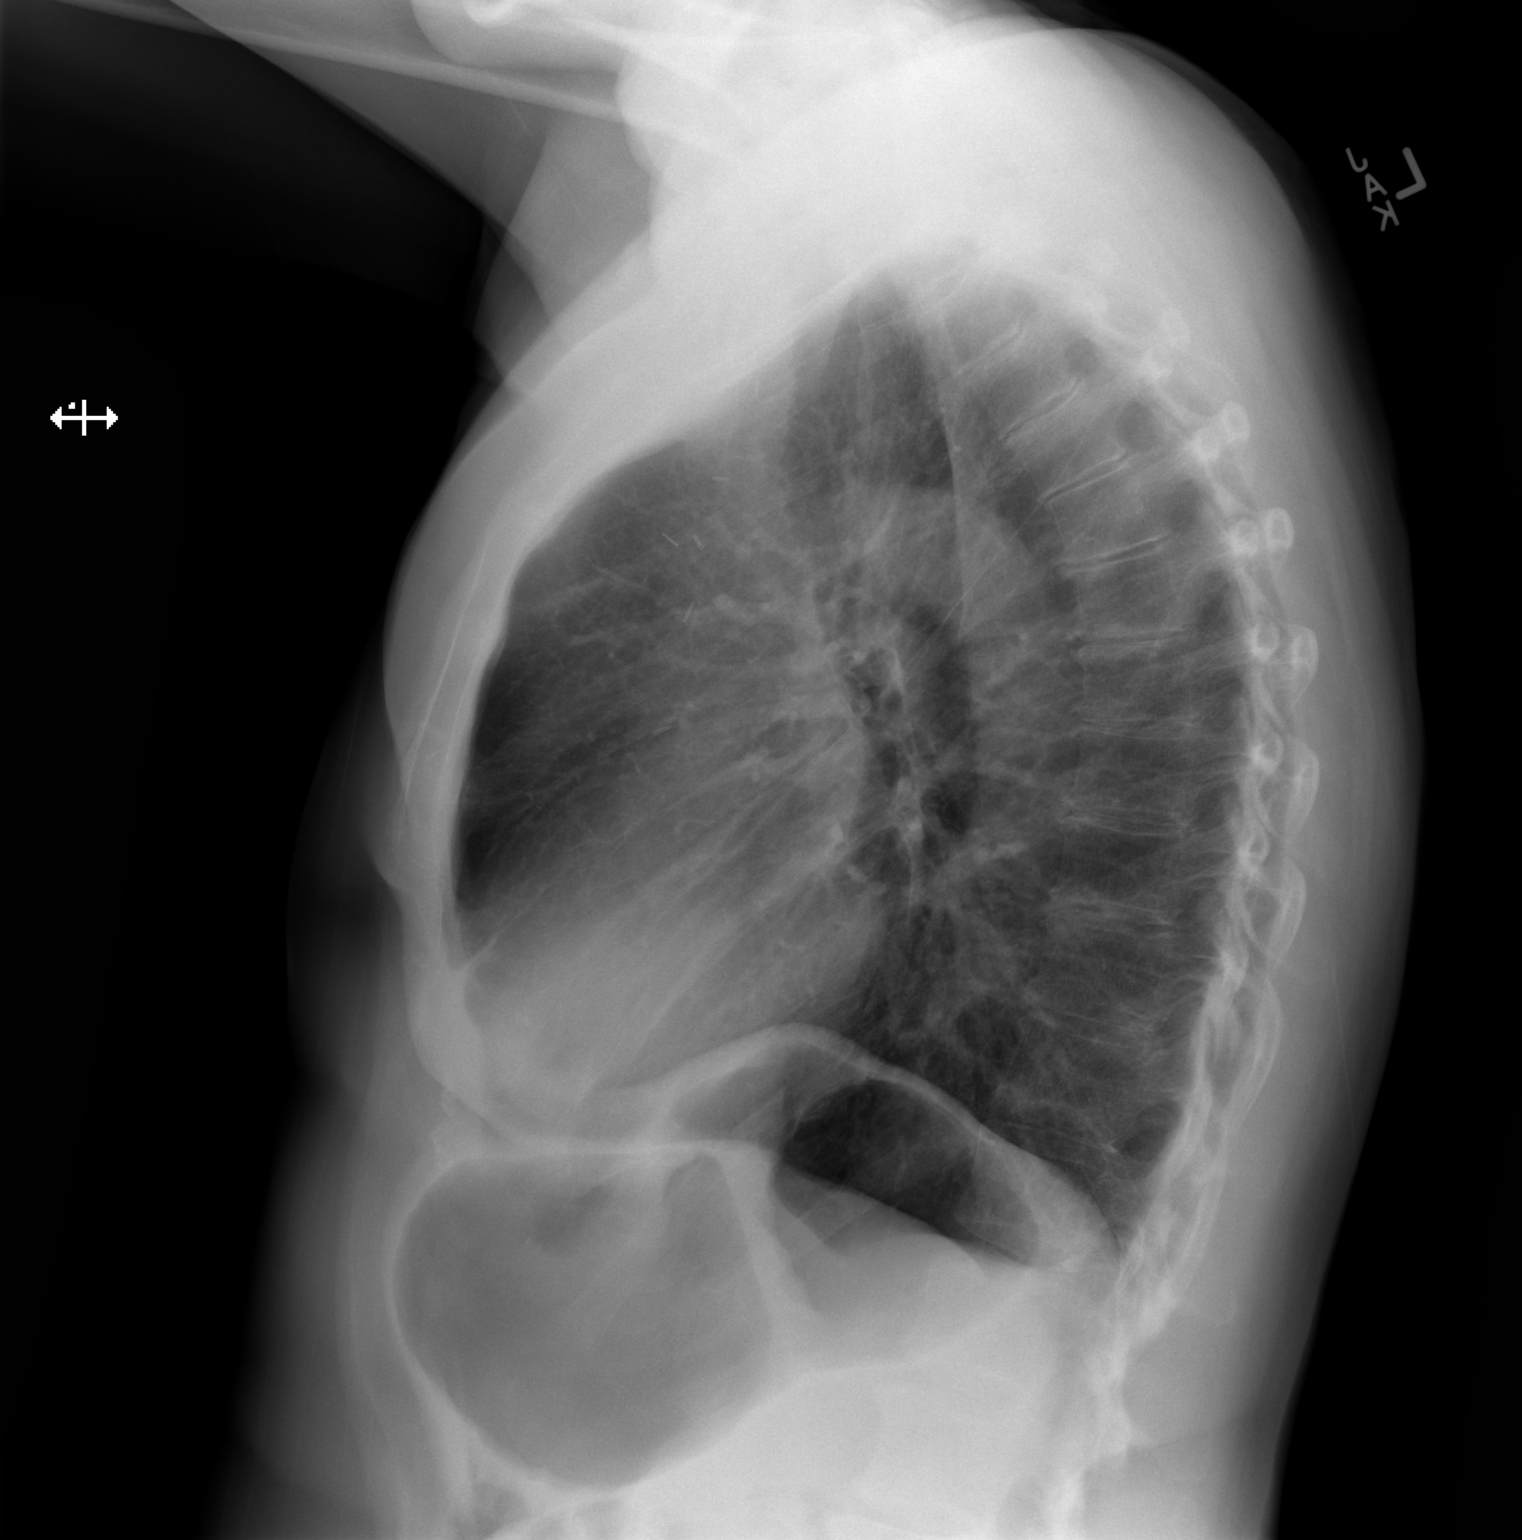

[2 of 2 positions shown; findings below may reference images not displayed]

FINDINGS: Postoperative left breast and axilla. There is no edema,
consolidation, effusion, or pneumothorax. Normal heart size and
mediastinal contours. No acute or focal osseous finding.
IMPRESSION: Stable compared to 5148.  No explanation for symptoms.

## 2022-10-31 ENCOUNTER — Ambulatory Visit
Admission: RE | Admit: 2022-10-31 | Discharge: 2022-10-31 | Disposition: A | Discharge status: Home / Self Care | Payer: Medicare Other | Source: Ambulatory Visit | Attending: Internal Medicine | Admitting: Internal Medicine

## 2022-10-31 DIAGNOSIS — M85851 Other specified disorders of bone density and structure, right thigh: Secondary | ICD-10-CM | POA: Diagnosis not present

## 2022-10-31 DIAGNOSIS — Z780 Asymptomatic menopausal state: Secondary | ICD-10-CM | POA: Diagnosis not present

## 2022-11-08 DIAGNOSIS — K219 Gastro-esophageal reflux disease without esophagitis: Secondary | ICD-10-CM | POA: Diagnosis not present

## 2022-11-08 DIAGNOSIS — R052 Subacute cough: Secondary | ICD-10-CM | POA: Diagnosis not present

## 2022-11-08 DIAGNOSIS — J300 Vasomotor rhinitis: Secondary | ICD-10-CM | POA: Diagnosis not present

## 2022-11-08 DIAGNOSIS — R21 Rash and other nonspecific skin eruption: Secondary | ICD-10-CM | POA: Diagnosis not present

## 2022-12-06 DIAGNOSIS — M25562 Pain in left knee: Secondary | ICD-10-CM | POA: Diagnosis not present

## 2022-12-06 DIAGNOSIS — D2261 Melanocytic nevi of right upper limb, including shoulder: Secondary | ICD-10-CM | POA: Diagnosis not present

## 2022-12-06 DIAGNOSIS — D2262 Melanocytic nevi of left upper limb, including shoulder: Secondary | ICD-10-CM | POA: Diagnosis not present

## 2022-12-06 DIAGNOSIS — D225 Melanocytic nevi of trunk: Secondary | ICD-10-CM | POA: Diagnosis not present

## 2022-12-06 DIAGNOSIS — L821 Other seborrheic keratosis: Secondary | ICD-10-CM | POA: Diagnosis not present

## 2022-12-06 DIAGNOSIS — D1801 Hemangioma of skin and subcutaneous tissue: Secondary | ICD-10-CM | POA: Diagnosis not present

## 2022-12-06 DIAGNOSIS — L570 Actinic keratosis: Secondary | ICD-10-CM | POA: Diagnosis not present

## 2022-12-06 DIAGNOSIS — L814 Other melanin hyperpigmentation: Secondary | ICD-10-CM | POA: Diagnosis not present

## 2022-12-06 DIAGNOSIS — D2371 Other benign neoplasm of skin of right lower limb, including hip: Secondary | ICD-10-CM | POA: Diagnosis not present

## 2023-01-04 ENCOUNTER — Ambulatory Visit (INDEPENDENT_AMBULATORY_CARE_PROVIDER_SITE_OTHER): Payer: Medicare Other | Admitting: Internal Medicine

## 2023-01-04 ENCOUNTER — Telehealth: Payer: Self-pay | Admitting: Internal Medicine

## 2023-01-04 ENCOUNTER — Encounter: Payer: Self-pay | Admitting: Internal Medicine

## 2023-01-04 DIAGNOSIS — T63301A Toxic effect of unspecified spider venom, accidental (unintentional), initial encounter: Secondary | ICD-10-CM | POA: Diagnosis not present

## 2023-01-04 MED ORDER — DOXYCYCLINE HYCLATE 100 MG PO TABS: 100.0000 mg | ORAL_TABLET | Freq: Two times a day (BID) | ORAL | 0 refills | Status: DC

## 2023-01-04 MED ORDER — MUPIROCIN 2 % EX OINT: 1.0000 | TOPICAL_OINTMENT | Freq: Two times a day (BID) | CUTANEOUS | 0 refills | Status: AC

## 2023-01-04 NOTE — Telephone Encounter (Signed)
scheduled

## 2023-01-04 NOTE — Telephone Encounter (Signed)
[name] [phone]  [name] called to say she was bitten by something over the weekend, now she has a spot that is around a inch to inch and half big that is red hot to touch and is itching and has some little bumps on it.It looks like it may be spreading, it is located on her right ankle.

## 2023-01-04 NOTE — Progress Notes (Signed)
   Subjective:    Patient ID: [name], female    DOB: 12-Jan-1946, 77 y.o.   MRN: [id number]  HPI    Review of Systems     Objective:   Physical Exam        Assessment & Plan:

## 2023-01-04 NOTE — Patient Instructions (Signed)
Prescribed doxycycline 100 mg twice daily for 7 days and Bactroban ointment to apply topically twice daily for few days.  It is not necessary to bandage the area since it is not draining.  Tetanus immunization is up-to-date.  Call if she has further questions or lesion not improving within a few days or sooner if worse.

## 2023-01-04 NOTE — Progress Notes (Signed)
   Subjective:    Patient ID: [name], female    DOB: [birth year], 77 y.o.   MRN: [id number]  HPI Patient seen today acutely with lesion medial ankle that started about 2 days ago. Was mowing her lawn and felt something like an insect sting or bite. Later noticed dark red lesion medial ankle with out drainage that was sore. No fever, chills, nausea or vomiting. Has not improved and still tender. She is concerned.  She is intolerant of numerous antibiotics including Macrodantin, Sulfa, Avelox.  She can take Keflex.  Remote history of Mnire's disease diagnosed by ENT physician but does not take chronic medical condition for it.  History of osteopenia.  History of insomnia and anxiety.  Recent bone density study had T-score of -1.8 right femoral neck.  This was the lowest score on the test.  She takes vitamin D supplementation.  History of bilateral mastectomies in 1993 by Dr. Jamey Ripa and had chemotherapy for breast cancer.  Last colonoscopy was in 2014 by Dr. Kinnie Scales.  Had repeat colonoscopy October 2023 by Dr. Orvan Falconer.  Review of Systems no headache, nausea, vomiting, fever or chills     Objective:   Physical Exam She is afebrile  Has violaceous/dark red macular lesions medial ankle near malleolus.  These are not draining or weeping.         Assessment & Plan:  Left fibula as this is most likely a brown recluse spider bite.  She has no systemic symptoms at the present time.  The lesion is uncomfortable but not draining.  Plan: I have prescribed doxycycline 100 mg twice daily for 7 days and Bactroban ointment to apply twice daily.  It is not necessary to bandage the area since it is not draining.  Her tetanus immunization is up-to-date.  She will call if she has further questions or not improving.

## 2023-02-18 ENCOUNTER — Other Ambulatory Visit: Payer: Self-pay | Admitting: Internal Medicine

## 2023-03-10 ENCOUNTER — Telehealth: Payer: Self-pay | Admitting: Internal Medicine

## 2023-03-10 NOTE — Telephone Encounter (Signed)
Scheduled

## 2023-03-10 NOTE — Telephone Encounter (Signed)
[name] [phone]  Dmiya called to say she has had some drastic changes in her hair like its dry, brittle, finger nails are cracking and she is very fatigued. She would like to come in a talk with you about these things.

## 2023-03-13 ENCOUNTER — Other Ambulatory Visit: Payer: Medicare Other

## 2023-03-13 DIAGNOSIS — R5383 Other fatigue: Secondary | ICD-10-CM

## 2023-03-13 DIAGNOSIS — L603 Nail dystrophy: Secondary | ICD-10-CM | POA: Diagnosis not present

## 2023-03-13 DIAGNOSIS — E559 Vitamin D deficiency, unspecified: Secondary | ICD-10-CM

## 2023-03-13 DIAGNOSIS — M8580 Other specified disorders of bone density and structure, unspecified site: Secondary | ICD-10-CM | POA: Diagnosis not present

## 2023-03-13 DIAGNOSIS — L678 Other hair color and hair shaft abnormalities: Secondary | ICD-10-CM | POA: Diagnosis not present

## 2023-03-13 NOTE — Progress Notes (Signed)
 Lab only 

## 2023-03-14 LAB — TSH: TSH: 3.26 mIU/L (ref 0.40–4.50)

## 2023-03-14 LAB — T4, FREE: Free T4: 1 ng/dL (ref 0.8–1.8)

## 2023-03-14 LAB — VITAMIN D 25 HYDROXY (VIT D DEFICIENCY, FRACTURES): Vit D, 25-Hydroxy: 57 ng/mL (ref 30–100)

## 2023-04-10 ENCOUNTER — Other Ambulatory Visit: Payer: Self-pay | Admitting: Internal Medicine

## 2023-04-25 ENCOUNTER — Other Ambulatory Visit: Payer: Medicare Other

## 2023-04-25 DIAGNOSIS — Z0000 Encounter for general adult medical examination without abnormal findings: Secondary | ICD-10-CM | POA: Diagnosis not present

## 2023-04-25 DIAGNOSIS — Z8659 Personal history of other mental and behavioral disorders: Secondary | ICD-10-CM

## 2023-04-25 DIAGNOSIS — Z13220 Encounter for screening for lipoid disorders: Secondary | ICD-10-CM | POA: Diagnosis not present

## 2023-04-25 DIAGNOSIS — Z1329 Encounter for screening for other suspected endocrine disorder: Secondary | ICD-10-CM | POA: Diagnosis not present

## 2023-04-25 DIAGNOSIS — H8109 Meniere's disease, unspecified ear: Secondary | ICD-10-CM | POA: Diagnosis not present

## 2023-04-25 DIAGNOSIS — M8580 Other specified disorders of bone density and structure, unspecified site: Secondary | ICD-10-CM

## 2023-04-25 DIAGNOSIS — E7849 Other hyperlipidemia: Secondary | ICD-10-CM

## 2023-04-25 DIAGNOSIS — G4709 Other insomnia: Secondary | ICD-10-CM

## 2023-04-25 DIAGNOSIS — Z853 Personal history of malignant neoplasm of breast: Secondary | ICD-10-CM | POA: Diagnosis not present

## 2023-04-26 LAB — COMPLETE METABOLIC PANEL WITH GFR
AG Ratio: 2.3 (calc) (ref 1.0–2.5)
ALT: 21 U/L (ref 6–29)
AST: 24 U/L (ref 10–35)
Albumin: 4.6 g/dL (ref 3.6–5.1)
Alkaline phosphatase (APISO): 69 U/L (ref 37–153)
BUN: 12 mg/dL (ref 7–25)
CO2: 30 mmol/L (ref 20–32)
Calcium: 9.9 mg/dL (ref 8.6–10.4)
Chloride: 101 mmol/L (ref 98–110)
Creat: 0.64 mg/dL (ref 0.60–1.00)
Globulin: 2 g/dL (ref 1.9–3.7)
Glucose, Bld: 90 mg/dL (ref 65–99)
Potassium: 4.4 mmol/L (ref 3.5–5.3)
Sodium: 141 mmol/L (ref 135–146)
Total Bilirubin: 0.7 mg/dL (ref 0.2–1.2)
Total Protein: 6.6 g/dL (ref 6.1–8.1)
eGFR: 92 mL/min/{1.73_m2} (ref >=60)

## 2023-04-26 LAB — CBC WITH DIFFERENTIAL/PLATELET
Absolute Monocytes: 348 {cells}/uL (ref 200–950)
Basophils Absolute: 30 cells/uL (ref 0–200)
Basophils Relative: 0.7 %
Eosinophils Absolute: 52 {cells}/uL (ref 15–500)
Eosinophils Relative: 1.2 %
HCT: 43.6 % (ref 35.0–45.0)
Hemoglobin: 14.8 g/dL (ref 11.7–15.5)
Lymphs Abs: 1337 {cells}/uL (ref 850–3900)
MCH: 31.2 pg (ref 27.0–33.0)
MCHC: 33.9 g/dL (ref 32.0–36.0)
MCV: 92 fL (ref 80.0–100.0)
MPV: 10.1 fL (ref 7.5–12.5)
Monocytes Relative: 8.1 %
Neutro Abs: 2533 {cells}/uL (ref 1500–7800)
Neutrophils Relative %: 58.9 %
Platelets: 189 10*3/uL (ref 140–400)
RBC: 4.74 10*6/uL (ref 3.80–5.10)
RDW: 11.8 % (ref 11.0–15.0)
Total Lymphocyte: 31.1 %
WBC: 4.3 10*3/uL (ref 3.8–10.8)

## 2023-04-26 LAB — LIPID PANEL
Cholesterol: 184 mg/dL (ref <200)
HDL: 70 mg/dL (ref >=50)
LDL Cholesterol (Calc): 91 mg/dL
Non-HDL Cholesterol (Calc): 114 mg/dL (ref <130)
Total CHOL/HDL Ratio: 2.6 (calc) (ref <5.0)
Triglycerides: 125 mg/dL (ref <150)

## 2023-04-26 LAB — TSH: TSH: 5 m[IU]/L — ABNORMAL HIGH (ref 0.40–4.50)

## 2023-04-27 ENCOUNTER — Other Ambulatory Visit: Payer: Medicare Other

## 2023-04-27 NOTE — Progress Notes (Signed)
Annual Wellness Visit    Patient Care Team: Margaree Mackintosh, MD as PCP - General (Internal Medicine)  Visit Date: 05/04/23   Chief Complaint  Patient presents with   Medicare Wellness    Subjective:   Patient: [name], Female    DOB: 11-28-1945, 77 y.o.   MRN: [id number]  [name] is a 77 y.o. Female who presents today for her Annual Wellness Visit. History of allergies, arthritis, breast cancer, cataract, chronic UTI, dyspareunia, eustachian tube dysfunction, GERD, hyperlipidemia, osteopenia, rhinitis.  She had a fall where her knee impacted concrete [place] Using mupirocin topically for abrasions..  Denies constipation.  Seen by dermatologist in 4/24 and did not have any lesions removed.  History of GERD treated with famotidine 10 mg as needed.  History of hyperlipidemia treated with atorvastatin 10 mg daily. Lipid panel normal.  History of insomnia treated with lorazepam 1 mg at bedtime.This works well for her.  Takes meclizine as needed for vertigo.  History of allergies treated with montelukast 10 mg at bedtime.  TSH elevated at 5.0 on 04/25/23, up from 3.26 one month ago. She has had issues with fingernails and hair and wonders if this is related.  She is intolerant of numerous antibiotics including Macrodantin, Sulfa, Avelox.  She can take Keflex.  Remote history of Mnire's disease diagnosed by ENT physician but does not take chronic medications for it.   History of osteopenia.  History of anxiety.  Recent bone density study had T-score of -1.8 right femoral neck.  This was the lowest score on the test.  She takes vitamin D supplementation.   She has a history of recurrent urinary infections but none recently.  She is intolerant to many antibiotics including Macrodantin, sulfa, Avelox.  She can take Keflex.  Has been diagnosed with Mnire's disease by ENT physician but does not take chronic medication for it.    She had bilateral mastectomies in 1993 by Dr. Jamey Ripa.  She had chemotherapy for breast cancer.   Had uterine polyp removed in 2010.  Fractured toe in 2011.  Bilateral cataract extractions in 2017.  History of pseudophakia of both eyes status post laser treatment June 2017, August 2017, and April 2018.  In 2019 she went to the emergency department with chest pain and MI was ruled out.  She saw Dr. Antoine Poche and was diagnosed with atypical chest pain.  He agreed that she remain on statin medication and she currently is on low-dose Lipitor.  History of lumbar disc disease L4-S1 with annular disc bulging and osteophytosis.   Had sinusitis January 2016 treated with Zithromax and Depo-Medrol.   History of bilateral mastectomies in 1993 by Dr. Jamey Ripa and had chemotherapy for breast cancer.  Glucose normal. Kidney, liver functions normal. Electrolytes normal. Blood proteins normal. CBC normal.    Last colonoscopy was in 2014 by Dr. Kinnie Scales.  Had repeat colonoscopy October 2023 by Dr. Orvan Falconer.  Social history: Husband is retired and formerly was an Careers information officer for the [employer].  She does not smoke.  Social alcohol consumption.  1 daughter.  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   Family history: Father with history of MI, hypertension and stroke.  Mother died at age 38 of a stroke.  Past Medical History:  Diagnosis Date   Allergy    Arthritis    Breast cancer (HCC) 1993   Cataract 01/14/2015  Chronic UTI    Dyspareunia    Eustachian tube dysfunction    GERD (gastroesophageal reflux disease)    Hyperlipidemia    Osteopenia    Rhinitis, nonallergic      Family History  Problem Relation Age of Onset   Stroke Mother    Hypertension Mother    Colon polyps Father    Hypertension Father    Prostate cancer Father 24   Diabetes Maternal Grandfather    Breast cancer Paternal Grandmother        dx in her 89s   Colon  cancer Neg Hx    Esophageal cancer Neg Hx    Stomach cancer Neg Hx    Rectal cancer Neg Hx          Review of Systems  Constitutional:  Negative for chills, fever, malaise/fatigue and weight loss.  HENT:  Negative for hearing loss, sinus pain and sore throat.   Respiratory:  Negative for cough, hemoptysis and shortness of breath.   Cardiovascular:  Negative for chest pain, palpitations, leg swelling and PND.  Gastrointestinal:  Negative for abdominal pain, constipation, diarrhea, heartburn, nausea and vomiting.  Genitourinary:  Negative for dysuria, frequency and urgency.  Musculoskeletal:  Negative for back pain, myalgias and neck pain.  Skin:  Negative for itching and rash.  Neurological:  Negative for dizziness, tingling, seizures and headaches.  Endo/Heme/Allergies:  Negative for polydipsia.  Psychiatric/Behavioral:  Negative for depression. The patient is not nervous/anxious.       Objective:   Vitals: BP 110/80   Ht 5' 6.25" (1.683 m)   Wt 154 lb (69.9 kg)   BMI 24.67 kg/m   Physical Exam Vitals and nursing note reviewed.  Constitutional:      General: She is not in acute distress.    Appearance: Normal appearance. She is not ill-appearing or toxic-appearing.  HENT:     Head: Normocephalic and atraumatic.     Right Ear: Hearing, tympanic membrane, ear canal and external ear normal.     Left Ear: Hearing, tympanic membrane, ear canal and external ear normal.     Mouth/Throat:     Pharynx: Oropharynx is clear.  Eyes:     Extraocular Movements: Extraocular movements intact.     Pupils: Pupils are equal, round, and reactive to light.  Neck:     Thyroid: No thyroid mass, thyromegaly or thyroid tenderness.     Vascular: No carotid bruit.  Cardiovascular:     Rate and Rhythm: Normal rate and regular rhythm. No extrasystoles are present.    Heart sounds: Normal heart sounds. No murmur heard.    No friction rub. No gallop.     Comments: Dorsalis pedis pulses normal  bilaterally. Pulmonary:     Effort: Pulmonary effort is normal.     Breath sounds: Normal breath sounds. No decreased breath sounds, wheezing, rhonchi or rales.  Chest:     Chest wall: No mass.  Abdominal:     Palpations: Abdomen is soft. There is no hepatomegaly, splenomegaly or mass.     Tenderness: There is no abdominal tenderness.     Hernia: No hernia is present.  Musculoskeletal:     Cervical back: Normal range of motion.     Right lower leg: No edema.     Left lower leg: No edema.  Lymphadenopathy:     Cervical: No cervical adenopathy.     Upper Body:     Right upper body: No supraclavicular adenopathy.     Left upper body:  No supraclavicular adenopathy.  Skin:    General: Skin is warm and dry.     Comments: Circular superficial abrasion right knee approximately 2.5 cm in diameter with clean base.  No secondary infection.  Neurological:     General: No focal deficit present.     Mental Status: She is alert and oriented to person, place, and time. Mental status is at baseline.     Sensory: Sensation is intact.     Motor: Motor function is intact. No weakness.     Deep Tendon Reflexes: Reflexes are normal and symmetric.  Psychiatric:        Attention and Perception: Attention normal.        Mood and Affect: Mood normal.        Speech: Speech normal.        Behavior: Behavior normal.        Thought Content: Thought content normal.        Cognition and Memory: Cognition normal.        Judgment: Judgment normal.      Most recent functional status assessment:    05/04/2023    2:58 PM  In your present state of health, do you have any difficulty performing the following activities:  Hearing? 0  Vision? 1  Difficulty concentrating or making decisions? 0  Walking or climbing stairs? 0  Dressing or bathing? 0  Doing errands, shopping? 0  Preparing Food and eating ? N  Using the Toilet? N  In the past six months, have you accidently leaked urine? Y  Do you have  problems with loss of bowel control? N  Managing your Medications? N  Managing your Finances? N  Housekeeping or managing your Housekeeping? N   Most recent fall risk assessment:    05/04/2023    3:07 PM  Fall Risk   Falls in the past year? 0  Number falls in past yr: 0  Injury with Fall? 0  Risk for fall due to : No Fall Risks  Follow up Falls evaluation completed;Falls prevention discussed    Most recent depression screenings:    05/04/2023    3:04 PM 10/03/2022   12:41 PM  PHQ 2/9 Scores  PHQ - 2 Score 0 0   Most recent cognitive screening:    05/04/2023    3:07 PM  6CIT Screen  What Year? 0 points  What month? 0 points  What time? 0 points  Count back from 20 0 points  Months in reverse 0 points  Repeat phrase 0 points  Total Score 0 points     Results:   Studies obtained and personally reviewed by me:  Last colonoscopy was in 2014 by Dr. Kinnie Scales.  Had repeat colonoscopy October 2023 by Dr. Orvan Falconer.  Labs:       Component Value Date/Time   NA 141 04/25/2023 1007   K 4.4 04/25/2023 1007   CL 101 04/25/2023 1007   CO2 30 04/25/2023 1007   GLUCOSE 90 04/25/2023 1007   BUN 12 04/25/2023 1007   CREATININE 0.64 04/25/2023 1007   CALCIUM 9.9 04/25/2023 1007   PROT 6.6 04/25/2023 1007   ALBUMIN 4.3 12/30/2016 1133   AST 24 04/25/2023 1007   ALT 21 04/25/2023 1007   ALKPHOS 73 12/30/2016 1133   BILITOT 0.7 04/25/2023 1007   GFRNONAA 91 04/21/2020 0918   GFRAA 106 04/21/2020 0918     Lab Results  Component Value Date   WBC 4.3 04/25/2023   HGB 14.8 04/25/2023  HCT 43.6 04/25/2023   MCV 92.0 04/25/2023   PLT 189 04/25/2023    Lab Results  Component Value Date   CHOL 184 04/25/2023   HDL 70 04/25/2023   LDLCALC 91 04/25/2023   TRIG 125 04/25/2023   CHOLHDL 2.6 04/25/2023    No results found for: "HGBA1C"   Lab Results  Component Value Date   TSH 5.00 (H) 04/25/2023    Assessment & Plan:   Right knee abrasion: clean with peroxide daily  after showering and cover.  GERD: stable with famotidine 10 mg as needed.  Hyperlipidemia: treated with atorvastatin 10 mg daily. Lipid panel normal.  Insomnia: stable with lorazepam 1 mg at bedtime.  Vertigo: treated with meclizine as needed.  Allergies: treated with montelukast 10 mg at bedtime.  Hypothyroidism: Likely has primary hypothyroidism so we will start levothyroxine 50 mcg daily. TSH elevated at 5.0 on 04/25/23, up from 3.26 one month ago.  Recheck TSH in 6 weeks.  Osteopenia: 10/31/22 bone density study had T-score of -1.8 right femoral neck.  This was the lowest score on the test.  She takes vitamin D supplementation.  Urinalysis normal today.   Pelvic deferred. Seen by gynecologist, Dr. Vincente Poli.  History of bilateral mastectomy.  Last colonoscopy was in 2014 by Dr. Kinnie Scales.  Had repeat colonoscopy October 2023 by Dr. Orvan Falconer. Recommended that colon cancer screening stop at age 65 and can consider repeat in 7 years.  Vaccine counseling: UTD on tetanus vaccine. She plans to get the flu, Covid-19, pneumonia vaccines this fall with separation in-between.  Return in 1 year or as needed.     Annual wellness visit done today including the all of the following: Reviewed patient's Family Medical History Reviewed and updated list of patient's medical providers Assessment of cognitive impairment was done Assessed patient's functional ability Established a written schedule for health screening services Health Risk Assessent Completed and Reviewed  Discussed health benefits of physical activity, and encouraged her to engage in regular exercise appropriate for her age and condition.        I,Alexander Ruley,acting as a Neurosurgeon for Margaree Mackintosh, MD.,have documented all relevant documentation on the behalf of Margaree Mackintosh, MD,as directed by  Margaree Mackintosh, MD while in the presence of Margaree Mackintosh, MD.   I, Margaree Mackintosh, MD, have reviewed all documentation for this visit.  The documentation on 05/13/23 for the exam, diagnosis, procedures, and orders are all accurate and complete.

## 2023-05-04 ENCOUNTER — Encounter: Payer: Self-pay | Admitting: Internal Medicine

## 2023-05-04 ENCOUNTER — Ambulatory Visit: Payer: Medicare Other | Admitting: Internal Medicine

## 2023-05-04 ENCOUNTER — Ambulatory Visit (INDEPENDENT_AMBULATORY_CARE_PROVIDER_SITE_OTHER): Payer: Medicare Other | Admitting: Internal Medicine

## 2023-05-04 VITALS — BP 110/80 | Ht 66.25 in | Wt 154.0 lb

## 2023-05-04 DIAGNOSIS — G4709 Other insomnia: Secondary | ICD-10-CM | POA: Diagnosis not present

## 2023-05-04 DIAGNOSIS — Z8659 Personal history of other mental and behavioral disorders: Secondary | ICD-10-CM

## 2023-05-04 DIAGNOSIS — Z853 Personal history of malignant neoplasm of breast: Secondary | ICD-10-CM

## 2023-05-04 DIAGNOSIS — E039 Hypothyroidism, unspecified: Secondary | ICD-10-CM

## 2023-05-04 DIAGNOSIS — Z87440 Personal history of urinary (tract) infections: Secondary | ICD-10-CM

## 2023-05-04 DIAGNOSIS — M8580 Other specified disorders of bone density and structure, unspecified site: Secondary | ICD-10-CM

## 2023-05-04 DIAGNOSIS — Z86010 Personal history of colonic polyps: Secondary | ICD-10-CM

## 2023-05-04 DIAGNOSIS — Z0000 Encounter for general adult medical examination without abnormal findings: Secondary | ICD-10-CM | POA: Diagnosis not present

## 2023-05-04 DIAGNOSIS — Z882 Allergy status to sulfonamides status: Secondary | ICD-10-CM

## 2023-05-04 DIAGNOSIS — H8109 Meniere's disease, unspecified ear: Secondary | ICD-10-CM | POA: Diagnosis not present

## 2023-05-04 LAB — POCT URINALYSIS DIP (CLINITEK)
Bilirubin, UA: NEGATIVE
Blood, UA: NEGATIVE
Glucose, UA: NEGATIVE mg/dL
Ketones, POC UA: NEGATIVE mg/dL
Leukocytes, UA: NEGATIVE
Nitrite, UA: NEGATIVE
POC PROTEIN,UA: NEGATIVE
Spec Grav, UA: 1.01 (ref 1.010–1.025)
Urobilinogen, UA: 0.2 U/dL
pH, UA: 6 (ref 5.0–8.0)

## 2023-05-04 MED ORDER — LEVOTHYROXINE SODIUM 50 MCG PO TABS: 50.0000 ug | ORAL_TABLET | Freq: Every day | ORAL | 0 refills | Status: DC

## 2023-05-04 NOTE — Patient Instructions (Addendum)
Next appointment: Follow up in one year for your annual wellness visit    Preventive Care 75 Years and Older, Female Preventive care refers to lifestyle choices and visits with your health care provider that can promote health and wellness. What does preventive care include? A yearly physical exam. This is also called an annual well check. Dental exams once or twice a year. Routine eye exams. Ask your health care provider how often you should have your eyes checked. Personal lifestyle choices, including: Daily care of your teeth and gums. Regular physical activity. Eating a healthy diet. Avoiding tobacco and drug use. Limiting alcohol use. Practicing safe sex. Taking low-dose aspirin every day. Taking vitamin and mineral supplements as recommended by your health care provider. What happens during an annual well check? The services and screenings done by your health care provider during your annual well check will depend on your age, overall health, lifestyle risk factors, and family history of disease. Counseling  Your health care provider may ask you questions about your: Alcohol use. Tobacco use. Drug use. Emotional well-being. Home and relationship well-being. Sexual activity. Eating habits. History of falls. Memory and ability to understand (cognition). Work and work Astronomer. Reproductive health. Screening  You may have the following tests or measurements: Height, weight, and BMI. Blood pressure. Lipid and cholesterol levels. These may be checked every 5 years, or more frequently if you are over 30 years old. Skin check. Lung cancer screening. You may have this screening every year starting at age 75 if you have a 30-pack-year history of smoking and currently smoke or have quit within the past 15 years. Fecal occult blood test (FOBT) of the stool. You may have this test every year starting at age 75. Flexible sigmoidoscopy or colonoscopy. You may have a  sigmoidoscopy every 5 years or a colonoscopy every 10 years starting at age 75. Hepatitis C blood test. Hepatitis B blood test. Sexually transmitted disease (STD) testing. Diabetes screening. This is done by checking your blood sugar (glucose) after you have not eaten for a while (fasting). You may have this done every 1-3 years. Bone density scan. This is done to screen for osteoporosis. You may have this done starting at age 75. Mammogram. This may be done every 1-2 years. Talk to your health care provider about how often you should have regular mammograms. Talk with your health care provider about your test results, treatment options, and if necessary, the need for more tests. Vaccines  Your health care provider may recommend certain vaccines, such as: Influenza vaccine. This is recommended every year. Tetanus, diphtheria, and acellular pertussis (Tdap, Td) vaccine. You may need a Td booster every 10 years. Zoster vaccine. You may need this after age 75. Pneumococcal 13-valent conjugate (PCV13) vaccine. One dose is recommended after age 75. Pneumococcal polysaccharide (PPSV23) vaccine. One dose is recommended after age 65. Talk to your health care provider about which screenings and vaccines you need and how often you need them. This information is not intended to replace advice given to you by your health care provider. Make sure you discuss any questions you have with your health care provider. Document Released: 09/11/2015 Document Revised: 05/04/2016 Document Reviewed: 06/16/2015 Elsevier Interactive Patient Education  2017 [employer].  Fall Prevention in the Home Falls can cause injuries. They can happen to people of all ages. There are many things you can do to make your home safe and to help prevent falls. What can I do on the outside of  my home? Regularly fix the edges of walkways and driveways and fix any cracks. Remove anything that might make you trip as you walk through a  door, such as a raised step or threshold. Trim any bushes or trees on the path to your home. Use bright outdoor lighting. Clear any walking paths of anything that might make someone trip, such as rocks or tools. Regularly check to see if handrails are loose or broken. Make sure that both sides of any steps have handrails. Any raised decks and porches should have guardrails on the edges. Have any leaves, snow, or ice cleared regularly. Use sand or salt on walking paths during winter. Clean up any spills in your garage right away. This includes oil or grease spills. What can I do in the bathroom? Use night lights. Install grab bars by the toilet and in the tub and shower. Do not use towel bars as grab bars. Use non-skid mats or decals in the tub or shower. If you need to sit down in the shower, use a plastic, non-slip stool. Keep the floor dry. Clean up any water that spills on the floor as soon as it happens. Remove soap buildup in the tub or shower regularly. Attach bath mats securely with double-sided non-slip rug tape. Do not have throw rugs and other things on the floor that can make you trip. What can I do in the bedroom? Use night lights. Make sure that you have a light by your bed that is easy to reach. Do not use any sheets or blankets that are too big for your bed. They should not hang down onto the floor. Have a firm chair that has side arms. You can use this for support while you get dressed. Do not have throw rugs and other things on the floor that can make you trip. What can I do in the kitchen? Clean up any spills right away. Avoid walking on wet floors. Keep items that you use a lot in easy-to-reach places. If you need to reach something above you, use a strong step stool that has a grab bar. Keep electrical cords out of the way. Do not use floor polish or wax that makes floors slippery. If you must use wax, use non-skid floor wax. Do not have throw rugs and other things  on the floor that can make you trip. What can I do with my stairs? Do not leave any items on the stairs. Make sure that there are handrails on both sides of the stairs and use them. Fix handrails that are broken or loose. Make sure that handrails are as long as the stairways. Check any carpeting to make sure that it is firmly attached to the stairs. Fix any carpet that is loose or worn. Avoid having throw rugs at the top or bottom of the stairs. If you do have throw rugs, attach them to the floor with carpet tape. Make sure that you have a light switch at the top of the stairs and the bottom of the stairs. If you do not have them, ask someone to add them for you. What else can I do to help prevent falls? Wear shoes that: Do not have high heels. Have rubber bottoms. Are comfortable and fit you well. Are closed at the toe. Do not wear sandals. If you use a stepladder: Make sure that it is fully opened. Do not climb a closed stepladder. Make sure that both sides of the stepladder are locked into place. Ask  someone to hold it for you, if possible. Clearly mark and make sure that you can see: Any grab bars or handrails. First and last steps. Where the edge of each step is. Use tools that help you move around (mobility aids) if they are needed. These include: Canes. Walkers. Scooters. Crutches. Turn on the lights when you go into a dark area. Replace any light bulbs as soon as they burn out. Set up your furniture so you have a clear path. Avoid moving your furniture around. If any of your floors are uneven, fix them. If there are any pets around you, be aware of where they are. Review your medicines with your doctor. Some medicines can make you feel dizzy. This can increase your chance of falling. Ask your doctor what other things that you can do to help prevent falls. This information is not intended to replace advice given to you by your health care provider. Make sure you discuss any  questions you have with your health care provider. Document Released: 06/11/2009 Document Revised: 01/21/2016 Document Reviewed: 09/19/2014 Elsevier Interactive Patient Education  2017 [employer].  It was a pleasure to see you today.  Continue current medications and follow-up in 1 year or as needed.  Vaccines discussed.    Colonoscopy is up-to-date.  Start low-dose thyroid replacement and follow-up in 6 weeks.

## 2023-05-13 ENCOUNTER — Encounter: Payer: Self-pay | Admitting: Internal Medicine

## 2023-06-09 DIAGNOSIS — Z6825 Body mass index (BMI) 25.0-25.9, adult: Secondary | ICD-10-CM | POA: Diagnosis not present

## 2023-06-09 DIAGNOSIS — N952 Postmenopausal atrophic vaginitis: Secondary | ICD-10-CM | POA: Diagnosis not present

## 2023-06-09 DIAGNOSIS — N853 Subinvolution of uterus: Secondary | ICD-10-CM | POA: Diagnosis not present

## 2023-06-09 DIAGNOSIS — Z124 Encounter for screening for malignant neoplasm of cervix: Secondary | ICD-10-CM | POA: Diagnosis not present

## 2023-06-15 ENCOUNTER — Other Ambulatory Visit: Payer: Medicare Other

## 2023-06-15 DIAGNOSIS — E039 Hypothyroidism, unspecified: Secondary | ICD-10-CM

## 2023-06-16 LAB — TSH: TSH: 1.15 m[IU]/L (ref 0.40–4.50)

## 2023-06-20 ENCOUNTER — Telehealth: Payer: Self-pay | Admitting: Internal Medicine

## 2023-06-20 NOTE — Telephone Encounter (Signed)
Left detailed message.   

## 2023-06-20 NOTE — Telephone Encounter (Signed)
Patient called and wanted to know if Dr Lenord Fellers wants her to continue taking levothyroxine once a day or switch to every other day. Please advise

## 2023-07-30 ENCOUNTER — Other Ambulatory Visit: Payer: Self-pay | Admitting: Internal Medicine

## 2023-08-01 NOTE — Telephone Encounter (Signed)
Called patient and scheduled 6 month follow up on TSH

## 2023-08-01 NOTE — Telephone Encounter (Signed)
Copied from CRM [id number]. Topic: Clinical - Medical Advice >> Aug 01, 2023  2:13 PM Chantha C wrote: Reason for CRM: Pt started levothyroxine (SYNTHROID) 50 MCG tablet a couple of months ago, pt is unsure if she needs to come back for an OV/recheck in 6 months. Pls advise and c/b [phone]

## 2023-08-03 ENCOUNTER — Other Ambulatory Visit: Payer: Self-pay | Admitting: Internal Medicine

## 2023-09-27 DIAGNOSIS — Z961 Presence of intraocular lens: Secondary | ICD-10-CM | POA: Diagnosis not present

## 2023-10-10 ENCOUNTER — Other Ambulatory Visit: Payer: Medicare Other

## 2023-10-10 DIAGNOSIS — E039 Hypothyroidism, unspecified: Secondary | ICD-10-CM | POA: Diagnosis not present

## 2023-10-10 DIAGNOSIS — M8580 Other specified disorders of bone density and structure, unspecified site: Secondary | ICD-10-CM | POA: Diagnosis not present

## 2023-10-10 DIAGNOSIS — Z1329 Encounter for screening for other suspected endocrine disorder: Secondary | ICD-10-CM

## 2023-10-11 LAB — TSH: TSH: 1.5 m[IU]/L (ref 0.40–4.50)

## 2023-10-12 ENCOUNTER — Encounter: Payer: Self-pay | Admitting: Internal Medicine

## 2023-10-12 ENCOUNTER — Ambulatory Visit: Payer: Medicare Other | Admitting: Internal Medicine

## 2023-10-12 VITALS — BP 120/80 | HR 66 | Ht 66.25 in | Wt 159.0 lb

## 2023-10-12 DIAGNOSIS — K219 Gastro-esophageal reflux disease without esophagitis: Secondary | ICD-10-CM | POA: Diagnosis not present

## 2023-10-12 DIAGNOSIS — Z8659 Personal history of other mental and behavioral disorders: Secondary | ICD-10-CM | POA: Diagnosis not present

## 2023-10-12 DIAGNOSIS — E7800 Pure hypercholesterolemia, unspecified: Secondary | ICD-10-CM

## 2023-10-12 DIAGNOSIS — E039 Hypothyroidism, unspecified: Secondary | ICD-10-CM | POA: Diagnosis not present

## 2023-10-12 DIAGNOSIS — Z882 Allergy status to sulfonamides status: Secondary | ICD-10-CM

## 2023-10-12 DIAGNOSIS — H8109 Meniere's disease, unspecified ear: Secondary | ICD-10-CM

## 2023-10-12 DIAGNOSIS — G4709 Other insomnia: Secondary | ICD-10-CM | POA: Diagnosis not present

## 2023-10-12 DIAGNOSIS — Z860101 Personal history of adenomatous and serrated colon polyps: Secondary | ICD-10-CM

## 2023-10-12 DIAGNOSIS — J3089 Other allergic rhinitis: Secondary | ICD-10-CM | POA: Diagnosis not present

## 2023-10-12 DIAGNOSIS — Z853 Personal history of malignant neoplasm of breast: Secondary | ICD-10-CM | POA: Diagnosis not present

## 2023-10-12 NOTE — Progress Notes (Signed)
 Patient Care Team: Margaree Mackintosh, MD as PCP - General (Internal Medicine)  Visit Date: 10/12/23  Subjective:   Chief Complaint  Patient presents with   Hypothyroidism   Patient XL:KGMWN C Sano,Female DOB:1946-08-25,77 y.o. UUV:[id number]   78 y.o. Female presents today for 6 months follow-up for Hypothyroidism. Patient has a past medical history of HLD and Anxiety. Last seen in this office 05/04/2023 for her annual visit, in the interim she has seen Marcelle Overlie in October for her gynecological exam, of which results can not be seen, and most recently at the end of January saw Holli Humbles with Ophthalmology for bilateral pseudophakia.   History of Hypothyroidism treated with 50 mcg Levothyroxine daily. 10/10/2023 TSH 1.50. Endorses improvement in energy and hair growth. Does notes some constipation, though acknowledges that this could be related to many different things.  Past Medical History:  Diagnosis Date   Allergy    Arthritis    Breast cancer (HCC) 1993   Cataract 01/14/2015   Chronic UTI    Dyspareunia    Eustachian tube dysfunction    GERD (gastroesophageal reflux disease)    Hyperlipidemia    Osteopenia    Rhinitis, nonallergic     Allergies  Allergen Reactions   Macrodantin Hives and Swelling    Lips swell also   Nitrofuran Derivatives Hives   Sulfa Antibiotics Swelling    Only the lower lip became swollen   Avelox [Moxifloxacin Hcl In Nacl] Hives and Rash    Family History  Problem Relation Age of Onset   Stroke Mother    Hypertension Mother    Colon polyps Father    Hypertension Father    Prostate cancer Father 52   Diabetes Maternal Grandfather    Breast cancer Paternal Grandmother        dx in her 76s   Colon cancer Neg Hx    Esophageal cancer Neg Hx    Stomach cancer Neg Hx    Rectal cancer Neg Hx    Social History   Social History Narrative   Lives with husband.     Review of Systems  Constitutional:  Negative for malaise/fatigue.        (-) Hair Loss  Gastrointestinal:  Positive for constipation.     Objective:  Vitals: BP 120/80   Pulse 66   Ht 5' 6.25" (1.683 m)   Wt 159 lb (72.1 kg)   SpO2 98%   BMI 25.47 kg/m   Physical Exam Vitals and nursing note reviewed.  Constitutional:      General: She is not in acute distress.    Appearance: Normal appearance. She is not toxic-appearing.  HENT:     Head: Normocephalic and atraumatic.  Pulmonary:     Effort: Pulmonary effort is normal.  Skin:    General: Skin is warm and dry.  Neurological:     Mental Status: She is alert and oriented to person, place, and time. Mental status is at baseline.  Psychiatric:        Mood and Affect: Mood normal.        Behavior: Behavior normal.        Thought Content: Thought content normal.        Judgment: Judgment normal.     Results:  Studies Obtained And Personally Reviewed By Me: Labs:     Component Value Date/Time   NA 141 04/25/2023 1007   K 4.4 04/25/2023 1007   CL 101 04/25/2023 1007   CO2 30 04/25/2023  1007   GLUCOSE 90 04/25/2023 1007   BUN 12 04/25/2023 1007   CREATININE 0.64 04/25/2023 1007   CALCIUM 9.9 04/25/2023 1007   PROT 6.6 04/25/2023 1007   ALBUMIN 4.3 12/30/2016 1133   AST 24 04/25/2023 1007   ALT 21 04/25/2023 1007   ALKPHOS 73 12/30/2016 1133   BILITOT 0.7 04/25/2023 1007   GFRNONAA 91 04/21/2020 0918   GFRAA 106 04/21/2020 0918    Lab Results  Component Value Date   WBC 4.3 04/25/2023   HGB 14.8 04/25/2023   HCT 43.6 04/25/2023   MCV 92.0 04/25/2023   PLT 189 04/25/2023   Lab Results  Component Value Date   CHOL 184 04/25/2023   HDL 70 04/25/2023   LDLCALC 91 04/25/2023   TRIG 125 04/25/2023   CHOLHDL 2.6 04/25/2023   Lab Results  Component Value Date   TSH 1.50 10/10/2023   Assessment & Plan:   Hypothyroidism treated with 50 mcg Levothyroxine daily. 10/10/2023 TSH 1.50. Endorses improvement in energy and hair growth. Notes some constipation, though she acknowledges  that this could be related to many different things.   Hyperlipidemia treated with low dose Atorvastatin and lipid panel at [facility] in August was normal  Remote hx of breast cancer with bilateral mastectomies by Dr, Jamey Ripa  GERD treated with Pepcid  Osteopenia  Rhinitis treated with Flonase and Singulair  Hx of recurrent UTIs - none recently  Anxiety/Insomnia treated with Lorazepam at bedtime if needed  Hx of Meniere's disease - stable with no recent attacks  I,Emily Lagle,acting as a scribe for Margaree Mackintosh, MD.,have documented all relevant documentation on the behalf of Margaree Mackintosh, MD,as directed by  Margaree Mackintosh, MD while in the presence of Margaree Mackintosh, MD.   I, Margaree Mackintosh, MD, have reviewed all documentation for this visit. The documentation on 10/27/23 for the exam, diagnosis, procedures, and orders are all accurate and complete.

## 2023-10-27 ENCOUNTER — Encounter: Payer: Self-pay | Admitting: Internal Medicine

## 2023-10-27 NOTE — Patient Instructions (Addendum)
 It was a pleasure to see you and see you are doing well. Continue same meds and return in 6 months

## 2023-11-07 ENCOUNTER — Other Ambulatory Visit: Payer: Self-pay | Admitting: Internal Medicine

## 2023-11-07 DIAGNOSIS — R21 Rash and other nonspecific skin eruption: Secondary | ICD-10-CM | POA: Diagnosis not present

## 2023-11-07 DIAGNOSIS — J300 Vasomotor rhinitis: Secondary | ICD-10-CM | POA: Diagnosis not present

## 2023-11-07 DIAGNOSIS — K219 Gastro-esophageal reflux disease without esophagitis: Secondary | ICD-10-CM | POA: Diagnosis not present

## 2023-12-26 DIAGNOSIS — D225 Melanocytic nevi of trunk: Secondary | ICD-10-CM | POA: Diagnosis not present

## 2023-12-26 DIAGNOSIS — L821 Other seborrheic keratosis: Secondary | ICD-10-CM | POA: Diagnosis not present

## 2023-12-26 DIAGNOSIS — L814 Other melanin hyperpigmentation: Secondary | ICD-10-CM | POA: Diagnosis not present

## 2023-12-26 DIAGNOSIS — D1801 Hemangioma of skin and subcutaneous tissue: Secondary | ICD-10-CM | POA: Diagnosis not present

## 2023-12-26 DIAGNOSIS — D2371 Other benign neoplasm of skin of right lower limb, including hip: Secondary | ICD-10-CM | POA: Diagnosis not present

## 2023-12-26 DIAGNOSIS — D2272 Melanocytic nevi of left lower limb, including hip: Secondary | ICD-10-CM | POA: Diagnosis not present

## 2024-01-31 ENCOUNTER — Other Ambulatory Visit: Payer: Self-pay | Admitting: Internal Medicine

## 2024-03-06 ENCOUNTER — Other Ambulatory Visit: Payer: Self-pay | Admitting: Internal Medicine

## 2024-04-17 DIAGNOSIS — L817 Pigmented purpuric dermatosis: Secondary | ICD-10-CM | POA: Diagnosis not present

## 2024-04-26 ENCOUNTER — Other Ambulatory Visit: Payer: Self-pay | Admitting: Internal Medicine

## 2024-05-06 ENCOUNTER — Other Ambulatory Visit: Payer: Medicare Other

## 2024-05-06 DIAGNOSIS — E7800 Pure hypercholesterolemia, unspecified: Secondary | ICD-10-CM | POA: Diagnosis not present

## 2024-05-06 DIAGNOSIS — M8580 Other specified disorders of bone density and structure, unspecified site: Secondary | ICD-10-CM

## 2024-05-06 DIAGNOSIS — E039 Hypothyroidism, unspecified: Secondary | ICD-10-CM

## 2024-05-06 DIAGNOSIS — K219 Gastro-esophageal reflux disease without esophagitis: Secondary | ICD-10-CM | POA: Diagnosis not present

## 2024-05-06 DIAGNOSIS — Z0000 Encounter for general adult medical examination without abnormal findings: Secondary | ICD-10-CM

## 2024-05-07 ENCOUNTER — Ambulatory Visit: Payer: Medicare Other | Admitting: Internal Medicine

## 2024-05-07 ENCOUNTER — Encounter: Payer: Self-pay | Admitting: Internal Medicine

## 2024-05-07 VITALS — BP 110/80 | HR 72 | Ht 66.0 in | Wt 158.0 lb

## 2024-05-07 DIAGNOSIS — H8109 Meniere's disease, unspecified ear: Secondary | ICD-10-CM | POA: Diagnosis not present

## 2024-05-07 DIAGNOSIS — Z8659 Personal history of other mental and behavioral disorders: Secondary | ICD-10-CM | POA: Diagnosis not present

## 2024-05-07 DIAGNOSIS — E039 Hypothyroidism, unspecified: Secondary | ICD-10-CM

## 2024-05-07 DIAGNOSIS — M8580 Other specified disorders of bone density and structure, unspecified site: Secondary | ICD-10-CM | POA: Diagnosis not present

## 2024-05-07 DIAGNOSIS — Z0000 Encounter for general adult medical examination without abnormal findings: Secondary | ICD-10-CM | POA: Diagnosis not present

## 2024-05-07 DIAGNOSIS — E7800 Pure hypercholesterolemia, unspecified: Secondary | ICD-10-CM | POA: Diagnosis not present

## 2024-05-07 DIAGNOSIS — Z853 Personal history of malignant neoplasm of breast: Secondary | ICD-10-CM

## 2024-05-07 DIAGNOSIS — K219 Gastro-esophageal reflux disease without esophagitis: Secondary | ICD-10-CM | POA: Diagnosis not present

## 2024-05-07 DIAGNOSIS — Z860101 Personal history of adenomatous and serrated colon polyps: Secondary | ICD-10-CM | POA: Diagnosis not present

## 2024-05-07 DIAGNOSIS — Z9013 Acquired absence of bilateral breasts and nipples: Secondary | ICD-10-CM

## 2024-05-07 DIAGNOSIS — F439 Reaction to severe stress, unspecified: Secondary | ICD-10-CM | POA: Diagnosis not present

## 2024-05-07 DIAGNOSIS — Z87440 Personal history of urinary (tract) infections: Secondary | ICD-10-CM | POA: Diagnosis not present

## 2024-05-07 DIAGNOSIS — Z882 Allergy status to sulfonamides status: Secondary | ICD-10-CM | POA: Diagnosis not present

## 2024-05-07 LAB — LIPID PANEL
Cholesterol: 159 mg/dL (ref <200)
HDL: 69 mg/dL (ref >=50)
LDL Cholesterol (Calc): 70 mg/dL
Non-HDL Cholesterol (Calc): 90 mg/dL (ref <130)
Total CHOL/HDL Ratio: 2.3 (calc) (ref <5.0)
Triglycerides: 118 mg/dL (ref <150)

## 2024-05-07 LAB — COMPLETE METABOLIC PANEL WITHOUT GFR
AG Ratio: 2.4 (calc) (ref 1.0–2.5)
ALT: 16 U/L (ref 6–29)
AST: 20 U/L (ref 10–35)
Albumin: 4.3 g/dL (ref 3.6–5.1)
Alkaline phosphatase (APISO): 74 U/L (ref 37–153)
BUN/Creatinine Ratio: 16 (calc) (ref 6–22)
BUN: 9 mg/dL (ref 7–25)
CO2: 29 mmol/L (ref 20–32)
Calcium: 9.4 mg/dL (ref 8.6–10.4)
Chloride: 104 mmol/L (ref 98–110)
Creat: 0.57 mg/dL — ABNORMAL LOW (ref 0.60–1.00)
Globulin: 1.8 g/dL — ABNORMAL LOW (ref 1.9–3.7)
Glucose, Bld: 95 mg/dL (ref 65–99)
Potassium: 4.4 mmol/L (ref 3.5–5.3)
Sodium: 141 mmol/L (ref 135–146)
Total Bilirubin: 0.5 mg/dL (ref 0.2–1.2)
Total Protein: 6.1 g/dL (ref 6.1–8.1)

## 2024-05-07 LAB — CBC WITH DIFFERENTIAL/PLATELET
Absolute Lymphocytes: 1490 {cells}/uL (ref 850–3900)
Absolute Monocytes: 282 {cells}/uL (ref 200–950)
Basophils Absolute: 42 {cells}/uL (ref 0–200)
Basophils Relative: 0.9 %
Eosinophils Absolute: 71 {cells}/uL (ref 15–500)
Eosinophils Relative: 1.5 %
HCT: 44 % (ref 35.0–45.0)
Hemoglobin: 14.4 g/dL (ref 11.7–15.5)
MCH: 30.7 pg (ref 27.0–33.0)
MCHC: 32.7 g/dL (ref 32.0–36.0)
MCV: 93.8 fL (ref 80.0–100.0)
MPV: 10.1 fL (ref 7.5–12.5)
Monocytes Relative: 6 %
Neutro Abs: 2815 {cells}/uL (ref 1500–7800)
Neutrophils Relative %: 59.9 %
Platelets: 195 Thousand/uL (ref 140–400)
RBC: 4.69 Million/uL (ref 3.80–5.10)
RDW: 11.9 % (ref 11.0–15.0)
Total Lymphocyte: 31.7 %
WBC: 4.7 Thousand/uL (ref 3.8–10.8)

## 2024-05-07 LAB — TSH: TSH: 2.34 m[IU]/L (ref 0.40–4.50)

## 2024-05-07 NOTE — Progress Notes (Signed)
 Annual Wellness Visit   Patient Care Team: Perri Ronal PARAS, MD as PCP - General (Internal Medicine)  Visit Date: 05/07/24   Chief Complaint  Patient presents with   Medicare Wellness   Subjective:  Patient: [name], Female DOB: June 20, 1946, 78 y.o. MRN: [id number] Vitals:   05/07/24 1109  BP: 110/80   [name] is a 78 y.o. Female who presents today for her Annual Wellness Visit. Patient has History of breast cancer; Hyperlipidemia; Osteopenia; Recurrent urinary tract infection; Rhinitis; Eustachian tube dysfunction; Anxiety; RLQ abdominal pain; Postmenopausal atrophic vaginitis; Insomnia; and NS (nuclear sclerosis) on their problem list.   History of GERD treated with famotidine 10 mg as needed.   History of hypothyroidism treated with levothyroxine  50 mcg daily. TSH normal   History of hyperlipidemia treated with atorvastatin  10 mg daily. Lipid panel normal.   History of insomnia treated with lorazepam  1 mg at bedtime.This works well for her.   History of vertigo treated with meclizine  25 mg as needed.   History of allergies treated with montelukast 10 mg at bedtime.   She is intolerant of numerous antibiotics including Macrodantin, Sulfa , Avelox.  She can take Keflex .   Remote history of Mnire's disease diagnosed by ENT physician but does not take chronic medications for it.   History of anxiety treated with Ativan  1 mg at bedtime.   History of osteopenia.  Recent bone density study had T-score of -1.8 right femoral neck.  This was the lowest score on the test.  She takes vitamin D  supplementation.   She has a history of recurrent urinary infections but none recently.  She is intolerant to many antibiotics including Macrodantin, sulfa , Avelox.  She can take Keflex .  Has been diagnosed with Mnire's disease by ENT physician but does not take chronic medication for it.   She had bilateral mastectomies in 1993 by Dr. Merrilyn.  She had chemotherapy for breast  cancer.   Had uterine polyp removed in 2010.  Fractured toe in 2011.  Bilateral cataract extractions in 2017.  History of pseudophakia of both eyes status post laser treatment June 2017, August 2017, and April 2018.   In 2019 she went to the emergency department with chest pain and MI was ruled out.  She saw Dr. Lavona and was diagnosed with atypical chest pain.  He agreed that she remain on statin medication and she currently is on low-dose Lipitor.   History of lumbar disc disease L4-S1 with annular disc bulging and osteophytosis.   Had sinusitis January 2016 treated with Zithromax  and Depo-Medrol .   History of bilateral mastectomies in 1993 by Dr. Merrilyn and had chemotherapy for breast cancer.   Bone density 04/28/2022 ASSESSMENT: The BMD measured at Femur Neck Right is 0.785 g/cm2 with a T-score of -1.8. This patient is considered osteopenic/low bone mass according to World Health Organization [facility]) criteria.    Colonoscopy 06/16/2022 Non- bleeding internal hemorrhoids. Diverticulosis in the sigmoid colon, in the descending colon and in the ascending colon. Two 3 mm polyps at the hepatic flexure and in the cecum, removed with a cold snare. Resected and retrieved. The examination was otherwise normal on direct and retroflexion views. Labs 05/06/2024 Creatinine 0.57  Globulin 1.8  Otherwise WNL      Health Maintenance  Topic Date Due   Hepatitis C Screening  Never done   Influenza Vaccine  03/29/2024   COVID-19 Vaccine (7 - 2025-26 season) 04/29/2024   Medicare Annual Wellness (AWV)  05/06/2025  DTaP/Tdap/Td (3 - Td or Tdap) 04/21/2026   Colonoscopy  06/16/2029   Pneumococcal Vaccine: 50+ Years  Completed   DEXA SCAN  Completed   HPV VACCINES  Aged Out   Meningococcal B Vaccine  Aged Out   MAMMOGRAM  Discontinued   Zoster Vaccines- Shingrix  Discontinued     ROS Objective:  Vitals: body mass index is 25.5 kg/m. Today's Vitals   05/07/24 1109  BP: 110/80  Pulse:  72  SpO2: 97%  Weight: 158 lb (71.7 kg)  Height: 5' 6 (1.676 m)  PainSc: 0-No pain   Physical Exam Neck:     Thyroid : No thyromegaly.  Cardiovascular:     Rate and Rhythm: Normal rate and regular rhythm.     Heart sounds: No murmur heard. Pulmonary:     Breath sounds: Normal breath sounds.  Lymphadenopathy:     Cervical: No cervical adenopathy.     Current Outpatient Medications  Medication Instructions   atorvastatin  (LIPITOR) 10 MG tablet TAKE 1 TABLET(10 MG) BY MOUTH DAILY   conjugated estrogens (PREMARIN) vaginal cream Daily   Cranberry, Vacc oxycoccus, (CRANBERRY EXTRACT) 200 MG CAPS No dose, route, or frequency recorded.   famotidine (PEPCID) 10 mg, As needed   fluticasone (FLONASE ALLERGY RELIEF) 50 MCG/ACT nasal spray Flonase Allergy Relief   levothyroxine  (SYNTHROID ) 50 MCG tablet TAKE 1 TABLET(50 MCG) BY MOUTH DAILY   LORazepam  (ATIVAN ) 1 MG tablet TAKE 1 TABLET(1 MG) BY MOUTH AT BEDTIME   meclizine  (ANTIVERT ) 25 mg, Oral, 3 times daily PRN   montelukast (SINGULAIR) 10 mg, Nightly   mupirocin  ointment (BACTROBAN ) 2 % 1 Application, Topical, 2 times daily   Propylene Glycol 0.6 % SOLN 1-2 drops, Daily PRN   Vitamin D3 2,000 Units, Daily   Past Medical History:  Diagnosis Date   Allergy    Arthritis    Breast cancer (HCC) 1993   Cataract 01/14/2015   Chronic UTI    Dyspareunia    Eustachian tube dysfunction    GERD (gastroesophageal reflux disease)    Hyperlipidemia    Osteopenia    Rhinitis, nonallergic    Medical/Surgical History Narrative:  Allergic/Intolerant to:  Allergies  Allergen Reactions   Macrodantin Hives and Swelling    Lips swell also   Nitrofuran Derivatives Hives   Sulfa  Antibiotics Swelling    Only the lower lip became swollen   Avelox [Moxifloxacin Hcl In Nacl] Hives and Rash    Past Surgical History:  Procedure Laterality Date   CATARACT EXTRACTION     COLONOSCOPY     endometrial polyp  2010   Dr. Nikki   MASTECTOMY  1993    bilateral   Family History  Problem Relation Age of Onset   Stroke Mother    Hypertension Mother    Colon polyps Father    Hypertension Father    Prostate cancer Father 41   Diabetes Maternal Grandfather    Breast cancer Paternal Grandmother        dx in her 1s   Colon cancer Neg Hx    Esophageal cancer Neg Hx    Stomach cancer Neg Hx    Rectal cancer Neg Hx     Social History   Social History Narrative   Lives with husband.     Most Recent Health Risks Assessment:   Medicare Risk at Home - 05/07/24 1105     Any stairs in or around the home? Yes    If so, are there any without handrails? No  Home free of loose throw rugs in walkways, pet beds, electrical cords, etc? Yes    Adequate lighting in your home to reduce risk of falls? Yes    Life alert? No    Use of a cane, walker or w/c? Yes    Grab bars in the bathroom? Yes    Shower chair or bench in shower? Yes    Elevated toilet seat or a handicapped toilet? No         Most Recent Social Determinants of Health (Including Hx of Tobacco, Alcohol, and Drug Use) SDOH Screenings   Food Insecurity: No Food Insecurity (05/07/2024)  Housing: Low Risk  (05/07/2024)  Transportation Needs: No Transportation Needs (05/07/2024)  Utilities: Not At Risk (05/04/2023)  Alcohol Screen: Low Risk  (05/07/2024)  Depression (PHQ2-9): Low Risk  (05/07/2024)  Financial Resource Strain: High Risk (05/07/2024)  Physical Activity: Sufficiently Active (05/07/2024)  Social Connections: Moderately Integrated (05/07/2024)  Stress: No Stress Concern Present (05/04/2023)  Tobacco Use: Medium Risk (05/07/2024)  Health Literacy: Adequate Health Literacy (05/04/2023)   Social History   Tobacco Use   Smoking status: Former    Current packs/day: 0.00    Average packs/day: 0.5 packs/day for 3.5 years (1.7 ttl pk-yrs)    Types: Cigarettes    Start date: 12/22/1960    Quit date: 06/22/1964    Years since quitting: 59.9   Smokeless tobacco: Never  Vaping Use    Vaping status: Never Used  Substance Use Topics   Alcohol use: Yes    Alcohol/week: 2.0 standard drinks of alcohol    Types: 2 Glasses of wine per week    Comment: social   Drug use: No   Most Recent Functional Status Assessment:    05/07/2024   11:04 AM  In your present state of health, do you have any difficulty performing the following activities:  Hearing? 0  Vision? 0  Difficulty concentrating or making decisions? 0  Walking or climbing stairs? 0  Dressing or bathing? 0  Doing errands, shopping? 0  Preparing Food and eating ? N  Using the Toilet? N  In the past six months, have you accidently leaked urine? N  Do you have problems with loss of bowel control? N  Managing your Medications? N  Managing your Finances? N  Housekeeping or managing your Housekeeping? N   Most Recent Fall Risk Assessment:    05/07/2024   11:06 AM  Fall Risk   Falls in the past year? 0  Number falls in past yr: 0  Injury with Fall? 0  Risk for fall due to : No Fall Risks  Follow up Falls prevention discussed;Education provided;Falls evaluation completed   Most Recent Anxiety/Depression Screenings:    05/07/2024   11:06 AM 05/04/2023    3:04 PM  PHQ 2/9 Scores  PHQ - 2 Score 1 0    Most Recent Cognitive Screening:    05/07/2024   11:06 AM  6CIT Screen  What Year? 0 points  What month? 0 points  What time? 0 points  Count back from 20 0 points  Months in reverse 0 points  Repeat phrase 0 points  Total Score 0 points   Most Recent Vision/Hearing Screenings:No results found. Results:  Studies Obtained And Personally Reviewed By Me:    Bone density 04/28/2022 ASSESSMENT: The BMD measured at Femur Neck Right is 0.785 g/cm2 with a T-score of -1.8. This patient is considered osteopenic/low bone mass according to World Health Organization [facility]) criteria.  Colonoscopy 06/16/2022 Non- bleeding internal hemorrhoids. Diverticulosis in the sigmoid colon, in the descending colon and in the  ascending colon. Two 3 mm polyps at the hepatic flexure and in the cecum, removed with a cold snare. Resected and retrieved. The examination was otherwise normal on direct and retroflexion views.  Labs:  CBC w/ Differential Lab Results  Component Value Date   WBC 4.7 05/06/2024   RBC 4.69 05/06/2024   HGB 14.4 05/06/2024   HCT 44.0 05/06/2024   PLT 195 05/06/2024   MCV 93.8 05/06/2024   MCH 30.7 05/06/2024   MCHC 32.7 05/06/2024   RDW 11.9 05/06/2024   MPV 10.1 05/06/2024   LYMPHSABS 1,337 04/25/2023   MONOABS 294 12/30/2016   BASOSABS 42 05/06/2024    Comprehensive Metabolic Panel Lab Results  Component Value Date   NA 141 05/06/2024   K 4.4 05/06/2024   CL 104 05/06/2024   CO2 29 05/06/2024   GLUCOSE 95 05/06/2024   BUN 9 05/06/2024   CREATININE 0.57 (L) 05/06/2024   CALCIUM  9.4 05/06/2024   PROT 6.1 05/06/2024   ALBUMIN 4.3 12/30/2016   AST 20 05/06/2024   ALT 16 05/06/2024   ALKPHOS 73 12/30/2016   BILITOT 0.5 05/06/2024   EGFR 92 04/25/2023   GFRNONAA 91 04/21/2020   Lipid Panel  Lab Results  Component Value Date   CHOL 159 05/06/2024   HDL 69 05/06/2024   LDLCALC 70 05/06/2024   TRIG 118 05/06/2024   A1c No results found for: HGBA1C  TSH Lab Results  Component Value Date   TSH 2.34 05/06/2024    No results found for any visits on 05/07/24. Assessment & Plan:   GERD: Treated with famotidine 10 mg as needed.   Hypothyroidism: Treated with levothyroxine  50 mcg daily. TSH normal   Hyperlipidemia: Treated with atorvastatin  10 mg daily. Lipid panel normal.   Insomnia: Treated with lorazepam  1 mg at bedtime.This works well for her.   Vertigo: Treated with  meclizine  25 mg as needed.   Allergies: Treated with montelukast 10 mg at bedtime.   Anxiety: Treated with Ativan  1 mg daily    Osteopenia: Recent bone density study had T-score of -1.8 right femoral neck.  This was the lowest score on the test.  She takes vitamin D  supplementation.   Bone  density 04/28/2022 ASSESSMENT: The BMD measured at Femur Neck Right is 0.785 g/cm2 with a T-score of -1.8. This patient is considered osteopenic/low bone mass according to World Health Organization [facility]) criteria.    Colonoscopy 06/16/2022 Non- bleeding internal hemorrhoids. Diverticulosis in the sigmoid colon, in the descending colon and in the ascending colon. Two 3 mm polyps at the hepatic flexure and in the cecum, removed with a cold snare. Resected and retrieved. The examination was otherwise normal on direct and retroflexion views.      Annual Wellness Visit done today including the all of the following: Reviewed patient's Family Medical History Reviewed patient's SDOH and reviewed tobacco, alcohol, and drug use.  Reviewed and updated list of patient's medical providers Assessment of cognitive impairment was done Assessed patient's functional ability Established a written schedule for health screening services Health Risk Assessent Completed and Reviewed  Discussed health benefits of physical activity, and encouraged her to engage in regular exercise appropriate for her age and condition.   I,Makayla C Reid,acting as a scribe for Ronal JINNY Hailstone, MD.,have documented all relevant documentation on the behalf of Ronal JINNY Hailstone, MD,as directed by  Ronal JINNY Hailstone, MD  while in the presence of Ronal JINNY Hailstone, MD.   I, Ronal JINNY Hailstone, MD, have reviewed all documentation for and agree with the above Annual Wellness Visit documentation.  Ronal JINNY Hailstone, MD Internal Medicine 05/07/2024

## 2024-05-07 NOTE — Progress Notes (Unsigned)
 Subjective:   [name] is a 78 y.o. female who presents for Medicare Annual (Subsequent) preventive examination.  Visit Complete: In person  Patient Medicare AWV questionnaire was completed by the patient on 05/07/2024; I have confirmed that all information answered by patient is correct and no changes since this date.  Cardiac Risk Factors include: dyslipidemia     Objective:    Today's Vitals   05/07/24 1109  BP: 110/80  Pulse: 72  SpO2: 97%  Weight: 158 lb (71.7 kg)  Height: 5' 6 (1.676 m)  PainSc: 0-No pain   Body mass index is 25.5 kg/m.     05/07/2024   11:06 AM 05/04/2023    3:07 PM 04/28/2022   11:01 AM 10/06/2017    2:22 PM  Advanced Directives  Does Patient Have a Medical Advance Directive? Yes No No No   Type of Estate agent of [place];Living will     Copy of Healthcare Power of Attorney in Chart? No - copy requested     Would patient like information on creating a medical advance directive?   No - Patient declined      Data saved with a previous flowsheet row definition    Current Medications (verified) Outpatient Encounter Medications as of 05/07/2024  Medication Sig   atorvastatin  (LIPITOR) 10 MG tablet TAKE 1 TABLET(10 MG) BY MOUTH DAILY   Cholecalciferol (VITAMIN D3) 2000 units TABS Take 2,000 Units by mouth daily.   conjugated estrogens (PREMARIN) vaginal cream daily.   Cranberry, Vacc oxycoccus, (CRANBERRY EXTRACT) 200 MG CAPS    famotidine (PEPCID) 10 MG tablet Take 10 mg by mouth as needed.   fluticasone (FLONASE ALLERGY RELIEF) 50 MCG/ACT nasal spray Flonase Allergy Relief   levothyroxine  (SYNTHROID ) 50 MCG tablet TAKE 1 TABLET(50 MCG) BY MOUTH DAILY   LORazepam  (ATIVAN ) 1 MG tablet TAKE 1 TABLET(1 MG) BY MOUTH AT BEDTIME   meclizine  (ANTIVERT ) 25 MG tablet TAKE 1 TABLET (25 MG TOTAL) BY MOUTH 3 (THREE) TIMES DAILY AS NEEDED FOR DIZZINESS.   montelukast (SINGULAIR) 10 MG tablet Take 10 mg by mouth at bedtime.   mupirocin   ointment (BACTROBAN ) 2 % Apply 1 Application topically 2 (two) times daily.   Propylene Glycol 0.6 % SOLN Place 1-2 drops into both eyes daily as needed (for dryness).    No facility-administered encounter medications on file as of 05/07/2024.    Allergies (verified) Macrodantin, Nitrofuran derivatives, Sulfa  antibiotics, and Avelox [moxifloxacin hcl in nacl]   History: Past Medical History:  Diagnosis Date   Allergy    Arthritis    Breast cancer (HCC) 1993   Cataract 01/14/2015   Chronic UTI    Dyspareunia    Eustachian tube dysfunction    GERD (gastroesophageal reflux disease)    Hyperlipidemia    Osteopenia    Rhinitis, nonallergic    Past Surgical History:  Procedure Laterality Date   CATARACT EXTRACTION     COLONOSCOPY     endometrial polyp  2010   Dr. Nikki   MASTECTOMY  1993   bilateral   Family History  Problem Relation Age of Onset   Stroke Mother    Hypertension Mother    Colon polyps Father    Hypertension Father    Prostate cancer Father 49   Diabetes Maternal Grandfather    Breast cancer Paternal Grandmother        dx in her 14s   Colon cancer Neg Hx    Esophageal cancer Neg Hx  Stomach cancer Neg Hx    Rectal cancer Neg Hx    Social History   Socioeconomic History   Marital status: Married    Spouse name: Not on file   Number of children: 1   Years of education: Not on file   Highest education level: Not on file  Occupational History   Not on file  Tobacco Use   Smoking status: Former    Current packs/day: 0.00    Average packs/day: 0.5 packs/day for 3.5 years (1.7 ttl pk-yrs)    Types: Cigarettes    Start date: 12/22/1960    Quit date: 06/22/1964    Years since quitting: 59.9   Smokeless tobacco: Never  Vaping Use   Vaping status: Never Used  Substance and Sexual Activity   Alcohol use: Yes    Alcohol/week: 2.0 standard drinks of alcohol    Types: 2 Glasses of wine per week    Comment: social   Drug use: No   Sexual activity:  Yes    Birth control/protection: None  Other Topics Concern   Not on file  Social History Narrative   Lives with husband.     Social Drivers of Corporate investment banker Strain: High Risk (05/07/2024)   Overall Financial Resource Strain (CARDIA)    Difficulty of Paying Living Expenses: Very hard  Food Insecurity: No Food Insecurity (05/07/2024)   Hunger Vital Sign    Worried About Running Out of Food in the Last Year: Never true    Ran Out of Food in the Last Year: Never true  Transportation Needs: No Transportation Needs (05/07/2024)   PRAPARE - Administrator, Civil Service (Medical): No    Lack of Transportation (Non-Medical): No  Physical Activity: Sufficiently Active (05/07/2024)   Exercise Vital Sign    Days of Exercise per Week: 4 days    Minutes of Exercise per Session: 50 min  Stress: No Stress Concern Present (05/04/2023)   [employer] of Occupational Health - Occupational Stress Questionnaire    Feeling of Stress : Only a little  Social Connections: Moderately Integrated (05/07/2024)   Social Connection and Isolation Panel    Frequency of Communication with Friends and Family: More than three times a week    Frequency of Social Gatherings with Friends and Family: More than three times a week    Attends Religious Services: Never    Database administrator or Organizations: No    Attends Engineer, structural: More than 4 times per year    Marital Status: Married    Tobacco Counseling Counseling given: Not Answered   Clinical Intake:     Pain Score: 0-No pain                  Activities of Daily Living    05/07/2024   11:04 AM  In your present state of health, do you have any difficulty performing the following activities:  Hearing? 0  Vision? 0  Difficulty concentrating or making decisions? 0  Walking or climbing stairs? 0  Dressing or bathing? 0  Doing errands, shopping? 0  Preparing Food and eating ? N  Using the Toilet? N   In the past six months, have you accidently leaked urine? N  Do you have problems with loss of bowel control? N  Managing your Medications? N  Managing your Finances? N  Housekeeping or managing your Housekeeping? N    Patient Care Team: Perri Ronal PARAS, MD as PCP -  General (Internal Medicine)  Indicate any recent Medical Services you may have received from other than [facility] providers in the past year (date may be approximate).     Assessment:   This is a routine wellness examination for [place].  Hearing/Vision screen No results found.   Goals Addressed   None    Depression Screen    05/07/2024   11:06 AM 05/04/2023    3:04 PM 10/03/2022   12:41 PM 04/28/2022   11:02 AM 04/26/2021    3:08 PM 04/24/2020    3:07 PM 04/22/2019   11:11 AM  PHQ 2/9 Scores  PHQ - 2 Score 1 0 0 0 0 1 0    Fall Risk    05/07/2024   11:06 AM 05/04/2023    3:07 PM 10/03/2022   12:41 PM 04/28/2022   11:02 AM 04/26/2021    3:09 PM  Fall Risk   Falls in the past year? 0 0 0 0 0  Number falls in past yr: 0 0 0 0 0  Injury with Fall? 0 0 0 0 0  Risk for fall due to : No Fall Risks No Fall Risks No Fall Risks No Fall Risks No Fall Risks  Follow up Falls prevention discussed;Education provided;Falls evaluation completed Falls evaluation completed;Falls prevention discussed Falls prevention discussed Falls evaluation completed  Falls evaluation completed      Data saved with a previous flowsheet row definition    MEDICARE RISK AT HOME: Medicare Risk at Home Any stairs in or around the home?: Yes If so, are there any without handrails?: No Home free of loose throw rugs in walkways, pet beds, electrical cords, etc?: Yes Adequate lighting in your home to reduce risk of falls?: Yes Life alert?: No Use of a cane, walker or w/c?: Yes Grab bars in the bathroom?: Yes Shower chair or bench in shower?: Yes Elevated toilet seat or a handicapped toilet?: No  TIMED UP AND GO:  Was the test performed?  No     Cognitive Function:        05/07/2024   11:06 AM 05/04/2023    3:07 PM 04/28/2022   11:04 AM  6CIT Screen  What Year? 0 points 0 points 0 points  What month? 0 points 0 points 0 points  What time? 0 points 0 points 0 points  Count back from 20 0 points 0 points 0 points  Months in reverse 0 points 0 points 0 points  Repeat phrase 0 points 0 points 0 points  Total Score 0 points 0 points 0 points    Immunizations Immunization History  Administered Date(s) Administered   INFLUENZA, HIGH DOSE SEASONAL PF 09/20/2016, 09/05/2017, 09/04/2018, 11/13/2019, 11/11/2020   Influenza Split 08/01/2012, 06/14/2013, 07/14/2015   Influenza-Unspecified 06/29/2014, 05/18/2016, 07/13/2017, 05/17/2019, 05/22/2020   Moderna Sars-Covid-2 Vaccination 09/27/2019, 10/25/2019, 11/13/2019, 06/22/2020   PFIZER(Purple Top)SARS-COV-2 Vaccination 02/09/2022   Pneumococcal Conjugate-13 05/08/2015   Pneumococcal Polysaccharide-23 10/18/2013, 09/20/2016, 09/05/2017, 09/04/2018, 11/13/2019, 11/11/2020   Tdap 09/27/2005, 04/21/2016   Unspecified SARS-COV-2 Vaccination 05/26/2021    TDAP status: Up to date  Flu Vaccine status: Due, Education has been provided regarding the importance of this vaccine. Advised may receive this vaccine at local pharmacy or Health Dept. Aware to provide a copy of the vaccination record if obtained from local pharmacy or Health Dept. Verbalized acceptance and understanding.  Pneumococcal vaccine status: Up to date  {Covid-19 vaccine status:2101808}  Qualifies for Shingles Vaccine? Yes   Zostavax completed No   Shingrix Completed?: No.  Education has been provided regarding the importance of this vaccine. Patient has been advised to call insurance company to determine out of pocket expense if they have not yet received this vaccine. Advised may also receive vaccine at local pharmacy or Health Dept. Verbalized acceptance and understanding.  Screening Tests Health Maintenance   Topic Date Due   Hepatitis C Screening  Never done   Influenza Vaccine  03/29/2024   COVID-19 Vaccine (7 - 2025-26 season) 04/29/2024   Medicare Annual Wellness (AWV)  05/06/2025   DTaP/Tdap/Td (3 - Td or Tdap) 04/21/2026   Colonoscopy  06/16/2029   Pneumococcal Vaccine: 50+ Years  Completed   DEXA SCAN  Completed   HPV VACCINES  Aged Out   Meningococcal B Vaccine  Aged Out   MAMMOGRAM  Discontinued   Zoster Vaccines- Shingrix  Discontinued    Health Maintenance  Health Maintenance Due  Topic Date Due   Hepatitis C Screening  Never done   Influenza Vaccine  03/29/2024   COVID-19 Vaccine (7 - 2025-26 season) 04/29/2024    Colorectal cancer screening: Type of screening: Colonoscopy. Completed 06/16/2022. Repeat every 7 years  Mammogram status: No longer required due to medical decision.  Bone Density status: Completed 10/31/2022. Results reflect: Bone density results: OSTEOPENIA. Repeat every 2 years.  Lung Cancer Screening: (Low Dose CT Chest recommended if Age 31-80 years, 20 pack-year currently smoking OR have quit w/in 15years.) does not qualify.   Additional Screening:  Hepatitis C Screening: does not qualify; Completed   Vision Screening: Recommended annual ophthalmology exams for early detection of glaucoma and other disorders of the eye. Is the patient up to date with their annual eye exam?  Yes  Who is the provider or what is the name of the office in which the patient attends annual eye exams? [facility] If pt is not established with a provider, would they like to be referred to a provider to establish care? No .   Dental Screening: Recommended annual dental exams for proper oral hygiene   Community Resource Referral / Chronic Care Management: CRR required this visit?  No   CCM required this visit?  No     Plan:     I have personally reviewed and noted the following in the patient's chart:   Medical and social history Use of alcohol,  tobacco or illicit drugs  Current medications and supplements including opioid prescriptions. Patient is not currently taking opioid prescriptions. Functional ability and status Nutritional status Physical activity Advanced directives List of other physicians Hospitalizations, surgeries, and ER visits in previous 12 months Vitals Screenings to include cognitive, depression, and falls Referrals and appointments  In addition, I have reviewed and discussed with patient certain preventive protocols, quality metrics, and best practice recommendations. A written personalized care plan for preventive services as well as general preventive health recommendations were provided to patient.     Araceli Zelda, CMA   05/07/2024   After Visit Summary: (In Person-Printed) AVS printed and given to the patient

## 2024-05-08 NOTE — Patient Instructions (Addendum)
 We are sorry to hear of your situational stress.  Labs are stable. No change in medications. Please call if you need anything at all. We are thinking of you. Plan to see you in one year for wellness visit or sooner if need arises.

## 2024-05-21 NOTE — Progress Notes (Signed)
 Annual Wellness Visit   Patient Care Team: Perri Ronal PARAS, MD as PCP - General (Internal Medicine)  Visit Date: 05/23/24   Chief Complaint  Patient presents with   Annual Exam   Subjective:  Patient: [name], Female DOB: 07-08-1946, 78 y.o. MRN: [id number] Vitals:   05/23/24 1410  BP: 110/80   [name] is a 78 y.o. Female who presents today for her Annual Wellness Visit. Patient has History of allergies, arthritis, breast cancer, cataract, chronic UTI, dyspareunia, eustachian tube dysfunction, GERD, hyperlipidemia, osteopenia, rhinitis.    She has no health complaints at this time. She says that because she hasn't been exercising as much, she has gained some weight.   History of GE Reflux treated with Famotidine 10 mg as needed.    History of Hypothyroidism treated with Levothyroxine  50 mcg. 05/06/2024 TSH 2.34.   History of Hyperlipidemia treated with Atorvastatin  10 mg daily.  05/06/2024 Lipid Panel normal.   History of Insomnia treated with Lorazepam  1 mg at bedtime. She says she takes half a tablet instead of a whole tablet as a whole tablet does not make her feel good.   History of Vertigo treated with Meclizine  25 mg as needed.   History of Allergic rhinitis treated with Montelukast 10 mg at bedtime.   History of Hypothyroidism treated with Levothyroxine  50 mcg daily. 05/06/2024 TSH 2.34   History of Anxiety treated with Lorazepam  1 mg at bedtime.    She is intolerant of numerous antibiotics including Macrodantin, Sulfa , Avelox.  She can take Keflex .   Remote history of Mnire's disease diagnosed by ENT physician but does not take chronic medications for it.    History of osteopenia.  04/28/22 bone density study had T-score of -1.8 right femoral neck. This was the lowest score on the test.  She takes vitamin D  2000 units daily .   She has a history of recurrent urinary infections but none recently.  She is intolerant to many antibiotics including  Macrodantin, sulfa , Avelox.  She can take Keflex .    She had bilateral mastectomies in 1993 by Dr. Merrilyn.  She had chemotherapy for breast cancer.   Had uterine polyp removed in 2010.  Fractured toe in 2011.  Bilateral cataract extractions in 2017.  History of pseudophakia of both eyes status post laser treatment June 2017, August 2017, and April 2018.   In 2019 she went to the emergency department with chest pain and MI was ruled out.  She saw Dr. Lavona and was diagnosed with atypical chest pain.  He agreed that she remain on statin medication and she currently is on low-dose Lipitor.   History of lumbar disc disease L4-S1 with annular disc bulging and osteophytosis.   Had sinusitis January 2016 treated with Zithromax  and Depo-Medrol .   History of bilateral mastectomies in 1993 by Dr. Merrilyn and had chemotherapy for breast cancer.    Colonoscopy 06/16/2022 Non- bleeding internal hemorrhoids. Diverticulosis in the sigmoid colon, in the descending colon and in the ascending colon. Two 3 mm polyps at the hepatic flexure and in the cecum, removed with a cold snare. Resected and retrieved. The examination was otherwise normal on direct and retroflexion views.   Labs 09/08/2025Creatinine 0.57,Globulin 1.8, Otherwise WNL     Vaccine counseling: RSV, Influenza, and Covid-19 vaccines discussed and Hepatitis C screening discontinued.     Health Maintenance  Topic Date Due   Influenza Vaccine  03/29/2024   COVID-19 Vaccine (7 - 2025-26 season) 04/29/2024  Hepatitis C Screening  05/23/2025 (Originally 10/21/1963)   Medicare Annual Wellness (AWV)  05/07/2025   DTaP/Tdap/Td (3 - Td or Tdap) 04/21/2026   Colonoscopy  06/16/2029   Pneumococcal Vaccine: 50+ Years  Completed   DEXA SCAN  Completed   HPV VACCINES  Aged Out   Meningococcal B Vaccine  Aged Out   Mammogram  Discontinued   Zoster Vaccines- Shingrix  Discontinued      Review of Systems  Constitutional:  Negative for fever and  malaise/fatigue.  HENT:  Negative for congestion.   Eyes:  Negative for blurred vision.  Respiratory:  Negative for cough and shortness of breath.   Cardiovascular:  Negative for chest pain, palpitations and leg swelling.  Gastrointestinal:  Negative for vomiting.  Musculoskeletal:  Negative for back pain.  Skin:  Negative for rash.  Neurological:  Negative for loss of consciousness and headaches.   Objective:  Vitals: body mass index is 26.02 kg/m. Today's Vitals   05/23/24 1410  BP: 110/80  Pulse: 98  SpO2: 98%  Weight: 160 lb (72.6 kg)  Height: 5' 5.75 (1.67 m)  PainSc: 0-No pain   Physical Exam Vitals and nursing note reviewed.  Constitutional:      General: She is not in acute distress.    Appearance: Normal appearance. She is not ill-appearing or toxic-appearing.  HENT:     Head: Normocephalic and atraumatic.     Right Ear: Hearing, tympanic membrane, ear canal and external ear normal.     Left Ear: Hearing, tympanic membrane, ear canal and external ear normal.     Mouth/Throat:     Pharynx: Oropharynx is clear.  Eyes:     Extraocular Movements: Extraocular movements intact.     Pupils: Pupils are equal, round, and reactive to light.  Neck:     Thyroid : No thyroid  mass, thyromegaly or thyroid  tenderness.     Vascular: No carotid bruit.  Cardiovascular:     Rate and Rhythm: Normal rate and regular rhythm. No extrasystoles are present.    Pulses:          Dorsalis pedis pulses are 2+ on the right side and 2+ on the left side.     Heart sounds: Normal heart sounds. No murmur heard.    No friction rub. No gallop.  Pulmonary:     Effort: Pulmonary effort is normal.     Breath sounds: Normal breath sounds. No decreased breath sounds, wheezing, rhonchi or rales.  Chest:     Chest wall: No mass.  Abdominal:     Palpations: Abdomen is soft. There is no hepatomegaly, splenomegaly or mass.     Tenderness: There is no abdominal tenderness.     Hernia: No hernia is  present.  Genitourinary:    Comments: GYN deferred.  Musculoskeletal:     Cervical back: Normal range of motion.     Right lower leg: No edema.     Left lower leg: No edema.  Lymphadenopathy:     Cervical: No cervical adenopathy.     Upper Body:     Right upper body: No supraclavicular adenopathy.     Left upper body: No supraclavicular adenopathy.  Skin:    General: Skin is warm and dry.  Neurological:     General: No focal deficit present.     Mental Status: She is alert and oriented to person, place, and time. Mental status is at baseline.     Sensory: Sensation is intact.     Motor: Motor  function is intact. No weakness.     Deep Tendon Reflexes: Reflexes are normal and symmetric.  Psychiatric:        Attention and Perception: Attention normal.        Mood and Affect: Mood normal.        Speech: Speech normal.        Behavior: Behavior normal.        Thought Content: Thought content normal.        Cognition and Memory: Cognition normal.        Judgment: Judgment normal.     Current Outpatient Medications  Medication Instructions   atorvastatin  (LIPITOR) 10 MG tablet TAKE 1 TABLET(10 MG) BY MOUTH DAILY   conjugated estrogens (PREMARIN) vaginal cream Daily   Cranberry, Vacc oxycoccus, (CRANBERRY EXTRACT) 200 MG CAPS No dose, route, or frequency recorded.   famotidine (PEPCID) 10 mg, As needed   fluticasone (FLONASE ALLERGY RELIEF) 50 MCG/ACT nasal spray Flonase Allergy Relief   levothyroxine  (SYNTHROID ) 50 MCG tablet TAKE 1 TABLET(50 MCG) BY MOUTH DAILY   LORazepam  (ATIVAN ) 1 MG tablet TAKE 1 TABLET(1 MG) BY MOUTH AT BEDTIME   meclizine  (ANTIVERT ) 25 mg, Oral, 3 times daily PRN   montelukast (SINGULAIR) 10 mg, Nightly   mupirocin  ointment (BACTROBAN ) 2 % 1 Application, Topical, 2 times daily   Propylene Glycol 0.6 % SOLN 1-2 drops, Daily PRN   Vitamin D3 2,000 Units, Daily   Past Medical History:  Diagnosis Date   Allergy    Arthritis    Breast cancer (HCC) 1993    Cataract 01/14/2015   Chronic UTI    Dyspareunia    Eustachian tube dysfunction    GERD (gastroesophageal reflux disease)    Hyperlipidemia    Osteopenia    Rhinitis, nonallergic    Medical/Surgical History Narrative:  Allergic/Intolerant to:  Allergies  Allergen Reactions   Macrodantin Hives and Swelling    Lips swell also   Nitrofuran Derivatives Hives   Sulfa  Antibiotics Swelling    Only the lower lip became swollen   Avelox [Moxifloxacin Hcl In Nacl] Hives and Rash    Past Surgical History:  Procedure Laterality Date   CATARACT EXTRACTION     COLONOSCOPY     endometrial polyp  2010   Dr. Nikki   MASTECTOMY  1993   bilateral   Family History  Problem Relation Age of Onset   Stroke Mother    Hypertension Mother    Colon polyps Father    Hypertension Father    Prostate cancer Father 55   Diabetes Maternal Grandfather    Breast cancer Paternal Grandmother        dx in her 55s   Colon cancer Neg Hx    Esophageal cancer Neg Hx    Stomach cancer Neg Hx    Rectal cancer Neg Hx    Family History Narrative: Father with history of MI, hypertension and stroke. Mother died at age 32 of a stroke.  Social History   Social History Narrative   Lives with husband.    Husband is retired and formerly was an Careers information officer for the [employer].  She does not smoke.  Social alcohol consumption.  1 daughter.  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  Most Recent Health Risks Assessment:   Most Recent Social Determinants of Health (Including Hx of Tobacco, Alcohol, and Drug Use) SDOH Screenings   Food Insecurity: No Food Insecurity (05/07/2024)  Housing: Low  Risk  (05/07/2024)  Transportation Needs: No Transportation Needs (05/07/2024)  Utilities: Not At Risk (05/04/2023)  Alcohol Screen: Low Risk  (05/07/2024)  Depression (PHQ2-9): Low Risk  (05/07/2024)  Financial Resource Strain: High Risk (05/07/2024)  Physical Activity:  Sufficiently Active (05/07/2024)  Social Connections: Moderately Integrated (05/07/2024)  Stress: No Stress Concern Present (05/04/2023)  Tobacco Use: Medium Risk (05/23/2024)  Health Literacy: Adequate Health Literacy (05/04/2023)   Social History   Tobacco Use   Smoking status: Former    Current packs/day: 0.00    Average packs/day: 0.5 packs/day for 3.5 years (1.7 ttl pk-yrs)    Types: Cigarettes    Start date: 12/22/1960    Quit date: 06/22/1964    Years since quitting: 59.9   Smokeless tobacco: Never  Vaping Use   Vaping status: Never Used  Substance Use Topics   Alcohol use: Yes    Alcohol/week: 2.0 standard drinks of alcohol    Types: 2 Glasses of wine per week    Comment: social   Drug use: No   Most Recent Functional Status Assessment:    05/07/2024   11:04 AM  In your present state of health, do you have any difficulty performing the following activities:  Hearing? 0  Vision? 0  Difficulty concentrating or making decisions? 0  Walking or climbing stairs? 0  Dressing or bathing? 0  Doing errands, shopping? 0  Preparing Food and eating ? N  Using the Toilet? N  In the past six months, have you accidently leaked urine? N  Do you have problems with loss of bowel control? N  Managing your Medications? N  Managing your Finances? N  Housekeeping or managing your Housekeeping? N   Most Recent Fall Risk Assessment:    05/07/2024   11:06 AM  Fall Risk   Falls in the past year? 0  Number falls in past yr: 0  Injury with Fall? 0  Risk for fall due to : No Fall Risks  Follow up Falls prevention discussed;Education provided;Falls evaluation completed   Most Recent Anxiety/Depression Screenings:    05/07/2024   11:06 AM 05/04/2023    3:04 PM  PHQ 2/9 Scores  PHQ - 2 Score 1 0    Most Recent Cognitive Screening:    05/07/2024   11:06 AM  6CIT Screen  What Year? 0 points  What month? 0 points  What time? 0 points  Count back from 20 0 points  Months in reverse 0 points   Repeat phrase 0 points  Total Score 0 points   Most Recent Vision/Hearing Screenings:No results found. Results:  Studies Obtained And Personally Reviewed By Me: Bone density 04/28/2022 ASSESSMENT: The BMD measured at Femur Neck Right is 0.785 g/cm2 with a T-score of -1.8. This patient is considered osteopenic/low bone mass according to World Health Organization [facility]) criteria.     Colonoscopy 06/16/2022 Non- bleeding internal hemorrhoids. Diverticulosis in the sigmoid colon, in the descending colon and in the ascending colon. Two 3 mm polyps at the hepatic flexure and in the cecum, removed with a cold snare. Resected and retrieved. The examination was otherwise normal on direct and retroflexion views.  Labs:  CBC w/ Differential Lab Results  Component Value Date   WBC 4.7 05/06/2024   RBC 4.69 05/06/2024   HGB 14.4 05/06/2024   HCT 44.0 05/06/2024   PLT 195 05/06/2024   MCV 93.8 05/06/2024   MCH 30.7 05/06/2024   MCHC 32.7 05/06/2024   RDW 11.9 05/06/2024   MPV 10.1  05/06/2024   LYMPHSABS 1,337 04/25/2023   MONOABS 294 12/30/2016   BASOSABS 42 05/06/2024    Comprehensive Metabolic Panel Lab Results  Component Value Date   NA 141 05/06/2024   K 4.4 05/06/2024   CL 104 05/06/2024   CO2 29 05/06/2024   GLUCOSE 95 05/06/2024   BUN 9 05/06/2024   CREATININE 0.57 (L) 05/06/2024   CALCIUM  9.4 05/06/2024   PROT 6.1 05/06/2024   ALBUMIN 4.3 12/30/2016   AST 20 05/06/2024   ALT 16 05/06/2024   ALKPHOS 73 12/30/2016   BILITOT 0.5 05/06/2024   EGFR 92 04/25/2023   GFRNONAA 91 04/21/2020   Lipid Panel  Lab Results  Component Value Date   CHOL 159 05/06/2024   HDL 69 05/06/2024   LDLCALC 70 05/06/2024   TRIG 118 05/06/2024    TSH Lab Results  Component Value Date   TSH 2.34 05/06/2024    Assessment & Plan:  Routine health maintenance exam performed today. Medicare wellness visit was done September 9th.Labs from Sept. 8th reviewed.and are WNL. Other issues  addressed at l Sept 8th visit are listed below GE Reflux: treated with Famotidine 10 mg as needed.    Hypothyroidism: treated with Levothyroxine  50 mcg.   Hyperlipidemia: treated with atorvastatin  10 mg daily.  05/06/2024 Lipid Panel normal.   Insomnia treated with lorazepam  1 mg at bedtime. She says she takes half a lorazepam  and a whole one disagrees with her.   Vertigo: treated with meclizine  25 mg as needed.   Allergic rhinitis: treated with montelukast 10 mg at bedtime.   Hypothyroidism: treated with levothyroxine  50 mcg daily. 05/06/2024 TSH 2.34   Anxiety: treated with Lorazepam  1 mg at bedtime.    Osteopenia: 04/28/22 bone density study had T-score of -1.8 right femoral neck.  This was the lowest score on the test.  She takes vitamin D  2000 units daily.     Bone density 04/28/2022 ASSESSMENT: The BMD measured at Femur Neck Right is 0.785 g/cm2 with a T-score of -1.8. This patient is considered osteopenic/low bone mass according to World Health Organization [facility]) criteria.    Colonoscopy 06/16/2022 Non- bleeding internal hemorrhoids. Diverticulosis in the sigmoid colon, in the descending colon and in the ascending colon. Two 3 mm polyps at the hepatic flexure and in the cecum, removed with a cold snare. Resected and retrieved. The examination was otherwise normal on direct and retroflexion views.  Vaccine counseling: RSV, Influenza, and Covid-19 vaccines discussed and hepatitis C screening discontinued.     Annual Wellness Visit done today including the all of the following: Reviewed patient's Family Medical History Reviewed patient's SDOH and reviewed tobacco, alcohol, and drug use.  Reviewed and updated list of patient's medical providers Assessment of cognitive impairment was done Assessed patient's functional ability Established a written schedule for health screening services Health Risk Assessent Completed and Reviewed  Discussed health benefits of physical  activity, and encouraged her to engage in regular exercise appropriate for her age and condition.    I,Makayla C Reid,acting as a scribe for Ronal JINNY Hailstone, MD.,have documented all relevant documentation on the behalf of Ronal JINNY Hailstone, MD,as directed by  Ronal JINNY Hailstone, MD while in the presence of Ronal JINNY Hailstone, MD.   I, Ronal JINNY Hailstone, MD, have reviewed all documentation for and agree with the above Annual Wellness Visit documentation.  Ronal JINNY Hailstone, MD Internal Medicine 05/23/2024

## 2024-05-23 ENCOUNTER — Encounter: Payer: Self-pay | Admitting: Internal Medicine

## 2024-05-23 ENCOUNTER — Ambulatory Visit (INDEPENDENT_AMBULATORY_CARE_PROVIDER_SITE_OTHER): Admitting: Internal Medicine

## 2024-05-23 VITALS — BP 110/80 | HR 98 | Ht 65.75 in | Wt 160.0 lb

## 2024-05-23 DIAGNOSIS — Z8659 Personal history of other mental and behavioral disorders: Secondary | ICD-10-CM | POA: Diagnosis not present

## 2024-05-23 DIAGNOSIS — K219 Gastro-esophageal reflux disease without esophagitis: Secondary | ICD-10-CM

## 2024-05-23 DIAGNOSIS — E039 Hypothyroidism, unspecified: Secondary | ICD-10-CM

## 2024-05-23 DIAGNOSIS — Z87440 Personal history of urinary (tract) infections: Secondary | ICD-10-CM | POA: Diagnosis not present

## 2024-05-23 DIAGNOSIS — Z882 Allergy status to sulfonamides status: Secondary | ICD-10-CM | POA: Diagnosis not present

## 2024-05-23 DIAGNOSIS — Z860101 Personal history of adenomatous and serrated colon polyps: Secondary | ICD-10-CM

## 2024-05-23 DIAGNOSIS — M8580 Other specified disorders of bone density and structure, unspecified site: Secondary | ICD-10-CM | POA: Diagnosis not present

## 2024-05-23 DIAGNOSIS — G4709 Other insomnia: Secondary | ICD-10-CM

## 2024-05-23 DIAGNOSIS — Z853 Personal history of malignant neoplasm of breast: Secondary | ICD-10-CM | POA: Diagnosis not present

## 2024-05-23 DIAGNOSIS — E7800 Pure hypercholesterolemia, unspecified: Secondary | ICD-10-CM

## 2024-05-23 DIAGNOSIS — F439 Reaction to severe stress, unspecified: Secondary | ICD-10-CM

## 2024-05-23 DIAGNOSIS — Z0000 Encounter for general adult medical examination without abnormal findings: Secondary | ICD-10-CM

## 2024-05-23 DIAGNOSIS — Z9013 Acquired absence of bilateral breasts and nipples: Secondary | ICD-10-CM

## 2024-05-23 DIAGNOSIS — H8109 Meniere's disease, unspecified ear: Secondary | ICD-10-CM

## 2024-05-26 NOTE — Patient Instructions (Addendum)
 Annual CPE performed today. Medicare wellness visit and medical issues addressed at last visit. Labs are stable. No change in medications. Return in one year or as needed.

## 2024-07-18 DIAGNOSIS — N952 Postmenopausal atrophic vaginitis: Secondary | ICD-10-CM | POA: Diagnosis not present

## 2024-07-18 DIAGNOSIS — Z853 Personal history of malignant neoplasm of breast: Secondary | ICD-10-CM | POA: Diagnosis not present

## 2024-07-18 DIAGNOSIS — Z6825 Body mass index (BMI) 25.0-25.9, adult: Secondary | ICD-10-CM | POA: Diagnosis not present

## 2024-07-18 DIAGNOSIS — Z779 Other contact with and (suspected) exposures hazardous to health: Secondary | ICD-10-CM | POA: Diagnosis not present

## 2024-07-18 DIAGNOSIS — Z124 Encounter for screening for malignant neoplasm of cervix: Secondary | ICD-10-CM | POA: Diagnosis not present

## 2024-07-24 ENCOUNTER — Other Ambulatory Visit: Payer: Self-pay | Admitting: Internal Medicine
